# Patient Record
Sex: Female | Born: 1935 | Race: White | Hispanic: No | Marital: Single | Smoking: Former smoker
Health system: Southern US, Community
[De-identification: ages and names within clinical notes are randomized; demographics above are authoritative.]

## PROBLEM LIST (undated history)

## (undated) DIAGNOSIS — M1710 Unilateral primary osteoarthritis, unspecified knee: Secondary | ICD-10-CM

## (undated) DIAGNOSIS — M1990 Unspecified osteoarthritis, unspecified site: Secondary | ICD-10-CM

## (undated) DIAGNOSIS — H3530 Unspecified macular degeneration: Secondary | ICD-10-CM

## (undated) DIAGNOSIS — A0472 Enterocolitis due to Clostridium difficile, not specified as recurrent: Secondary | ICD-10-CM

## (undated) DIAGNOSIS — R0609 Other forms of dyspnea: Secondary | ICD-10-CM

## (undated) DIAGNOSIS — R0600 Dyspnea, unspecified: Secondary | ICD-10-CM

## (undated) DIAGNOSIS — E785 Hyperlipidemia, unspecified: Secondary | ICD-10-CM

## (undated) DIAGNOSIS — E860 Dehydration: Secondary | ICD-10-CM

## (undated) DIAGNOSIS — R351 Nocturia: Secondary | ICD-10-CM

## (undated) DIAGNOSIS — R5383 Other fatigue: Secondary | ICD-10-CM

## (undated) DIAGNOSIS — I10 Essential (primary) hypertension: Secondary | ICD-10-CM

## (undated) DIAGNOSIS — R600 Localized edema: Secondary | ICD-10-CM

## (undated) HISTORY — DX: Hyperlipidemia, unspecified: E78.5

## (undated) HISTORY — DX: Localized edema: R60.0

## (undated) HISTORY — DX: Unspecified osteoarthritis, unspecified site: M19.90

## (undated) HISTORY — DX: Dyspnea, unspecified: R06.00

## (undated) HISTORY — PX: DILATION AND CURETTAGE OF UTERUS: SHX78

## (undated) HISTORY — DX: Essential (primary) hypertension: I10

## (undated) HISTORY — DX: Unspecified macular degeneration: H35.30

## (undated) HISTORY — DX: Dehydration: E86.0

## (undated) HISTORY — DX: Other forms of dyspnea: R06.09

## (undated) HISTORY — DX: Unilateral primary osteoarthritis, unspecified knee: M17.10

## (undated) HISTORY — DX: Enterocolitis due to Clostridium difficile, not specified as recurrent: A04.72

## (undated) HISTORY — DX: Other fatigue: R53.83

## (undated) HISTORY — DX: Nocturia: R35.1

---

## 1998-02-04 ENCOUNTER — Other Ambulatory Visit: Admission: RE | Admit: 1998-02-04 | Discharge: 1998-02-04 | Payer: Self-pay | Admitting: Cardiology

## 2004-01-08 ENCOUNTER — Ambulatory Visit (HOSPITAL_COMMUNITY): Admission: RE | Admit: 2004-01-08 | Discharge: 2004-01-08 | Payer: Self-pay | Admitting: Surgery

## 2004-03-07 ENCOUNTER — Ambulatory Visit (HOSPITAL_COMMUNITY): Admission: RE | Admit: 2004-03-07 | Discharge: 2004-03-08 | Payer: Self-pay | Admitting: Surgery

## 2004-03-07 ENCOUNTER — Encounter (INDEPENDENT_AMBULATORY_CARE_PROVIDER_SITE_OTHER): Payer: Self-pay | Admitting: Specialist

## 2004-07-18 ENCOUNTER — Encounter: Admission: RE | Admit: 2004-07-18 | Discharge: 2004-07-18 | Payer: Self-pay | Admitting: Rheumatology

## 2009-07-24 ENCOUNTER — Ambulatory Visit: Payer: Self-pay | Admitting: Oncology

## 2009-07-31 LAB — CBC WITH DIFFERENTIAL/PLATELET
Eosinophils Absolute: 0 10*3/uL (ref 0.0–0.5)
HCT: 35.1 % (ref 34.8–46.6)
HGB: 10.9 g/dL — ABNORMAL LOW (ref 11.6–15.9)
LYMPH%: 14 % (ref 14.0–49.7)
MONO#: 1.6 10*3/uL — ABNORMAL HIGH (ref 0.1–0.9)
NEUT#: 4.8 10*3/uL (ref 1.5–6.5)
NEUT%: 64.5 % (ref 38.4–76.8)
Platelets: 59 10*3/uL — ABNORMAL LOW (ref 145–400)
WBC: 7.4 10*3/uL (ref 3.9–10.3)
nRBC: 0 % (ref 0–0)

## 2009-07-31 LAB — IRON AND TIBC
%SAT: 24 % (ref 20–55)
Iron: 72 ug/dL (ref 42–145)
UIBC: 233 ug/dL

## 2009-08-26 ENCOUNTER — Ambulatory Visit: Payer: Self-pay | Admitting: Oncology

## 2009-08-26 LAB — CBC WITH DIFFERENTIAL/PLATELET
Basophils Absolute: 0 10*3/uL (ref 0.0–0.1)
HCT: 35.6 % (ref 34.8–46.6)
HGB: 10.7 g/dL — ABNORMAL LOW (ref 11.6–15.9)
MONO#: 1 10*3/uL — ABNORMAL HIGH (ref 0.1–0.9)
NEUT%: 51.4 % (ref 38.4–76.8)
WBC: 4.4 10*3/uL (ref 3.9–10.3)
lymph#: 1 10*3/uL (ref 0.9–3.3)

## 2009-10-17 ENCOUNTER — Ambulatory Visit: Payer: Self-pay | Admitting: Oncology

## 2009-10-21 LAB — CBC WITH DIFFERENTIAL/PLATELET
Basophils Absolute: 0 10*3/uL (ref 0.0–0.1)
Eosinophils Absolute: 0.1 10*3/uL (ref 0.0–0.5)
HCT: 36.9 % (ref 34.8–46.6)
HGB: 11.3 g/dL — ABNORMAL LOW (ref 11.6–15.9)
LYMPH%: 22.3 % (ref 14.0–49.7)
MCV: 82.7 fL (ref 79.5–101.0)
MONO%: 22.6 % — ABNORMAL HIGH (ref 0.0–14.0)
NEUT#: 2.3 10*3/uL (ref 1.5–6.5)
NEUT%: 52.2 % (ref 38.4–76.8)
Platelets: 54 10*3/uL — ABNORMAL LOW (ref 145–400)
RBC: 4.46 10*6/uL (ref 3.70–5.45)

## 2010-02-13 ENCOUNTER — Ambulatory Visit: Payer: Self-pay | Admitting: Oncology

## 2010-06-20 ENCOUNTER — Inpatient Hospital Stay (HOSPITAL_COMMUNITY): Admission: EM | Admit: 2010-06-20 | Discharge: 2010-07-01 | Payer: Self-pay | Admitting: Emergency Medicine

## 2010-07-24 ENCOUNTER — Ambulatory Visit (HOSPITAL_COMMUNITY): Admission: RE | Admit: 2010-07-24 | Discharge: 2010-07-24 | Payer: Self-pay | Admitting: Internal Medicine

## 2010-09-03 ENCOUNTER — Ambulatory Visit: Payer: Self-pay | Admitting: Cardiology

## 2010-10-03 ENCOUNTER — Emergency Department (HOSPITAL_COMMUNITY)
Admission: EM | Admit: 2010-10-03 | Discharge: 2010-10-03 | Payer: Self-pay | Source: Home / Self Care | Admitting: Emergency Medicine

## 2011-01-02 ENCOUNTER — Ambulatory Visit (INDEPENDENT_AMBULATORY_CARE_PROVIDER_SITE_OTHER): Payer: Medicare Other | Admitting: Cardiology

## 2011-01-02 ENCOUNTER — Ambulatory Visit: Payer: Self-pay | Admitting: Cardiology

## 2011-01-02 DIAGNOSIS — D61818 Other pancytopenia: Secondary | ICD-10-CM

## 2011-01-02 DIAGNOSIS — Z79899 Other long term (current) drug therapy: Secondary | ICD-10-CM

## 2011-01-02 DIAGNOSIS — E7800 Pure hypercholesterolemia, unspecified: Secondary | ICD-10-CM

## 2011-01-15 LAB — BASIC METABOLIC PANEL
BUN: 26 mg/dL — ABNORMAL HIGH (ref 6–23)
BUN: 26 mg/dL — ABNORMAL HIGH (ref 6–23)
BUN: 27 mg/dL — ABNORMAL HIGH (ref 6–23)
BUN: 32 mg/dL — ABNORMAL HIGH (ref 6–23)
BUN: 34 mg/dL — ABNORMAL HIGH (ref 6–23)
BUN: 42 mg/dL — ABNORMAL HIGH (ref 6–23)
BUN: 48 mg/dL — ABNORMAL HIGH (ref 6–23)
CO2: 22 mEq/L (ref 19–32)
CO2: 24 mEq/L (ref 19–32)
CO2: 24 mEq/L (ref 19–32)
CO2: 25 mEq/L (ref 19–32)
Calcium: 8 mg/dL — ABNORMAL LOW (ref 8.4–10.5)
Calcium: 8.5 mg/dL (ref 8.4–10.5)
Calcium: 8.6 mg/dL (ref 8.4–10.5)
Calcium: 9.3 mg/dL (ref 8.4–10.5)
Calcium: 9.4 mg/dL (ref 8.4–10.5)
Chloride: 102 mEq/L (ref 96–112)
Chloride: 105 mEq/L (ref 96–112)
Chloride: 107 mEq/L (ref 96–112)
Chloride: 113 mEq/L — ABNORMAL HIGH (ref 96–112)
Creatinine, Ser: 1.63 mg/dL — ABNORMAL HIGH (ref 0.4–1.2)
Creatinine, Ser: 1.72 mg/dL — ABNORMAL HIGH (ref 0.4–1.2)
Creatinine, Ser: 1.79 mg/dL — ABNORMAL HIGH (ref 0.4–1.2)
Creatinine, Ser: 2.14 mg/dL — ABNORMAL HIGH (ref 0.4–1.2)
Creatinine, Ser: 2.5 mg/dL — ABNORMAL HIGH (ref 0.4–1.2)
Creatinine, Ser: 2.93 mg/dL — ABNORMAL HIGH (ref 0.4–1.2)
Creatinine, Ser: 3.11 mg/dL — ABNORMAL HIGH (ref 0.4–1.2)
Creatinine, Ser: 3.11 mg/dL — ABNORMAL HIGH (ref 0.4–1.2)
GFR calc Af Amer: 17 mL/min — ABNORMAL LOW (ref >60)
GFR calc Af Amer: 21 mL/min — ABNORMAL LOW (ref >60)
GFR calc Af Amer: 22 mL/min — ABNORMAL LOW (ref >60)
GFR calc non Af Amer: 22 mL/min — ABNORMAL LOW (ref >60)
GFR calc non Af Amer: 27 mL/min — ABNORMAL LOW (ref >60)
GFR calc non Af Amer: 30 mL/min — ABNORMAL LOW (ref >60)
Glucose, Bld: 117 mg/dL — ABNORMAL HIGH (ref 70–99)
Glucose, Bld: 121 mg/dL — ABNORMAL HIGH (ref 70–99)
Glucose, Bld: 79 mg/dL (ref 70–99)
Glucose, Bld: 96 mg/dL (ref 70–99)
Potassium: 3.8 mEq/L (ref 3.5–5.1)
Potassium: 4 mEq/L (ref 3.5–5.1)
Potassium: 4.3 mEq/L (ref 3.5–5.1)
Potassium: 4.5 mEq/L (ref 3.5–5.1)

## 2011-01-15 LAB — URINE MICROSCOPIC-ADD ON

## 2011-01-15 LAB — URINALYSIS, MICROSCOPIC ONLY
Bilirubin Urine: NEGATIVE
Glucose, UA: NEGATIVE mg/dL
Specific Gravity, Urine: 1.012 (ref 1.005–1.030)

## 2011-01-15 LAB — CULTURE, BLOOD (ROUTINE X 2): Culture: NO GROWTH

## 2011-01-15 LAB — COMPREHENSIVE METABOLIC PANEL
ALT: 14 U/L (ref 0–35)
BUN: 44 mg/dL — ABNORMAL HIGH (ref 6–23)
CO2: 26 mEq/L (ref 19–32)
Calcium: 8.5 mg/dL (ref 8.4–10.5)
GFR calc non Af Amer: 18 mL/min — ABNORMAL LOW (ref >60)
Glucose, Bld: 132 mg/dL — ABNORMAL HIGH (ref 70–99)
Sodium: 134 mEq/L — ABNORMAL LOW (ref 135–145)

## 2011-01-15 LAB — DIFFERENTIAL
Eosinophils Absolute: 0 10*3/uL (ref 0.0–0.7)
Eosinophils Relative: 0 % (ref 0–5)
Lymphocytes Relative: 8 % — ABNORMAL LOW (ref 12–46)
Monocytes Absolute: 3 10*3/uL — ABNORMAL HIGH (ref 0.1–1.0)
Neutrophils Relative %: 79 % — ABNORMAL HIGH (ref 43–77)

## 2011-01-15 LAB — STOOL CULTURE

## 2011-01-15 LAB — CLOSTRIDIUM DIFFICILE EIA

## 2011-01-15 LAB — BRAIN NATRIURETIC PEPTIDE: Pro B Natriuretic peptide (BNP): 150 pg/mL — ABNORMAL HIGH (ref 0.0–100.0)

## 2011-01-15 LAB — CBC
HCT: 32.9 % — ABNORMAL LOW (ref 36.0–46.0)
HCT: 36.1 % (ref 36.0–46.0)
Hemoglobin: 10.1 g/dL — ABNORMAL LOW (ref 12.0–15.0)
Hemoglobin: 11.3 g/dL — ABNORMAL LOW (ref 12.0–15.0)
MCH: 24.7 pg — ABNORMAL LOW (ref 26.0–34.0)
MCH: 24.8 pg — ABNORMAL LOW (ref 26.0–34.0)
MCH: 25.1 pg — ABNORMAL LOW (ref 26.0–34.0)
MCH: 25.3 pg — ABNORMAL LOW (ref 26.0–34.0)
MCH: 25.4 pg — ABNORMAL LOW (ref 26.0–34.0)
MCH: 25.6 pg — ABNORMAL LOW (ref 26.0–34.0)
MCHC: 30.7 g/dL (ref 30.0–36.0)
MCHC: 31 g/dL (ref 30.0–36.0)
MCHC: 31.3 g/dL (ref 30.0–36.0)
MCHC: 31.3 g/dL (ref 30.0–36.0)
MCV: 79.4 fL (ref 78.0–100.0)
MCV: 80.8 fL (ref 78.0–100.0)
MCV: 81.6 fL (ref 78.0–100.0)
MCV: 81.8 fL (ref 78.0–100.0)
MCV: 81.9 fL (ref 78.0–100.0)
MCV: 84.2 fL (ref 78.0–100.0)
Platelets: 109 10*3/uL — ABNORMAL LOW (ref 150–400)
Platelets: 41 10*3/uL — ABNORMAL LOW (ref 150–400)
Platelets: 68 10*3/uL — ABNORMAL LOW (ref 150–400)
Platelets: 71 10*3/uL — ABNORMAL LOW (ref 150–400)
Platelets: DECREASED 10*3/uL (ref 150–400)
RBC: 3.98 MIL/uL (ref 3.87–5.11)
RBC: 4.02 MIL/uL (ref 3.87–5.11)
RDW: 13.7 % (ref 11.5–15.5)
RDW: 14.3 % (ref 11.5–15.5)
RDW: 14.4 % (ref 11.5–15.5)
WBC: 7.5 10*3/uL (ref 4.0–10.5)
WBC: 8.1 10*3/uL (ref 4.0–10.5)

## 2011-01-15 LAB — URINALYSIS, ROUTINE W REFLEX MICROSCOPIC
Protein, ur: 30 mg/dL — AB
Specific Gravity, Urine: 1.02 (ref 1.005–1.030)
Urobilinogen, UA: 1 mg/dL (ref 0.0–1.0)
pH: 5 (ref 5.0–8.0)

## 2011-01-15 LAB — RENAL FUNCTION PANEL
Albumin: 2.5 g/dL — ABNORMAL LOW (ref 3.5–5.2)
BUN: 41 mg/dL — ABNORMAL HIGH (ref 6–23)
Creatinine, Ser: 3.02 mg/dL — ABNORMAL HIGH (ref 0.4–1.2)
Glucose, Bld: 121 mg/dL — ABNORMAL HIGH (ref 70–99)
Phosphorus: 4.8 mg/dL — ABNORMAL HIGH (ref 2.3–4.6)
Potassium: 4 mEq/L (ref 3.5–5.1)

## 2011-01-15 LAB — URINE CULTURE

## 2011-01-15 LAB — OVA AND PARASITE EXAMINATION

## 2011-01-27 IMAGING — RF DG ESOPHAGUS
19 of 24 series · 19 of 24 positions shown · non-contrast
Comparison: None

CLINICAL DATA: Gastroesophageal reflux.

ESOPHOGRAM / BARIUM SWALLOW
TECHNIQUE: Double contrast esophagram was performed.
Fluoroscopy time:  3.0 minutes.

[Series 1: run · 1 of 11 slices shown (1 of 19)]
[im 1/11]
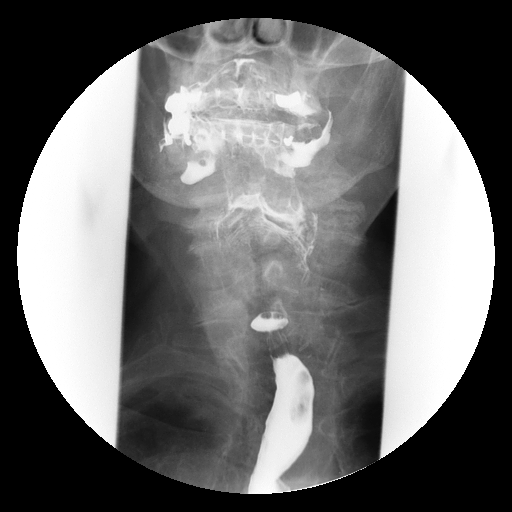

[Series 2: run · 1 of 9 slices shown (2 of 19)]
[im 1/9]
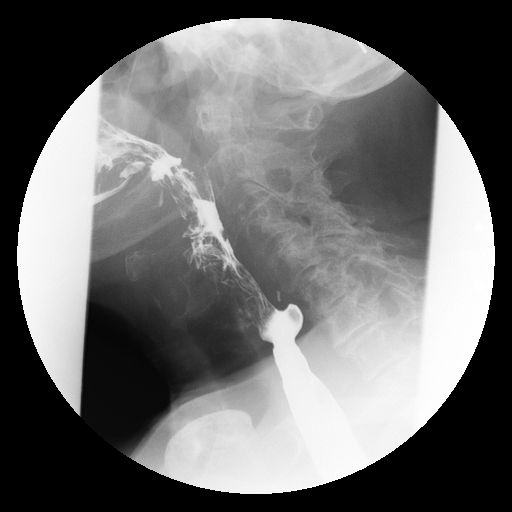

[Series 4: run · 1 of 1 slices shown (3 of 19)]
[im 1/1]
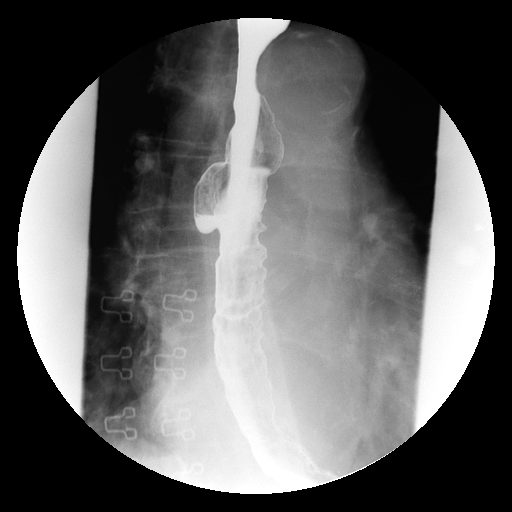

[Series 5: run · 1 of 2 slices shown (4 of 19)]
[im 1/2]
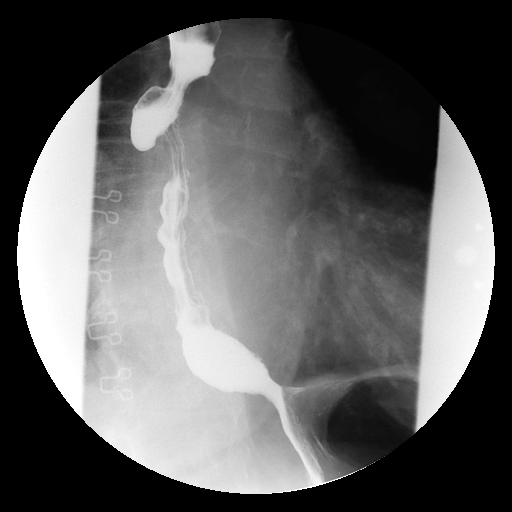

[Series 6: run · 1 of 1 slices shown (5 of 19)]
[im 1/1]
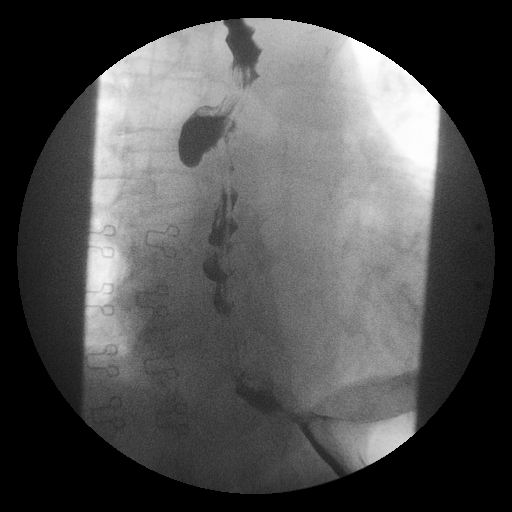

[Series 7: run · 1 of 1 slices shown (6 of 19)]
[im 1/1]
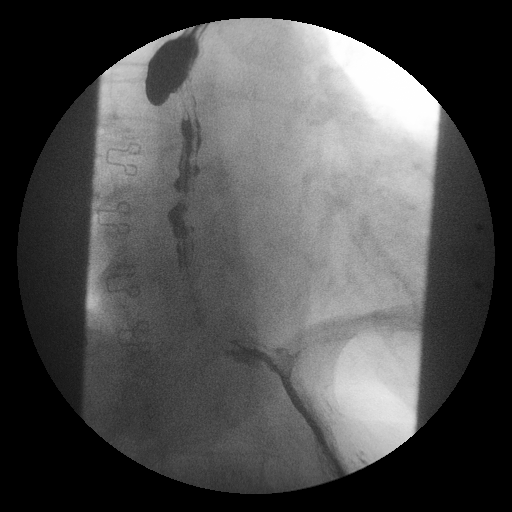

[Series 9: run · 1 of 2 slices shown (7 of 19)]
[im 1/2]
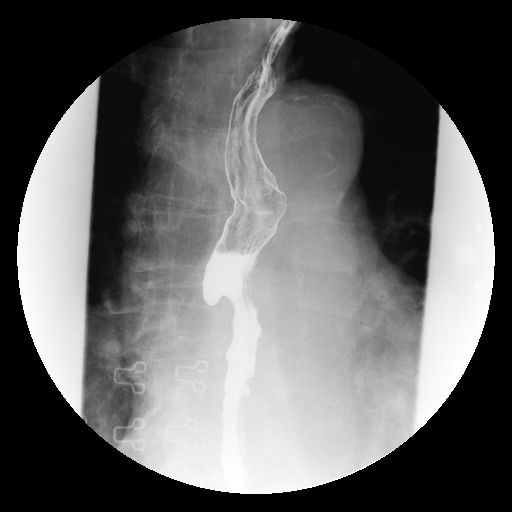

[Series 10: run · 1 of 1 slices shown (8 of 19)]
[im 1/1]
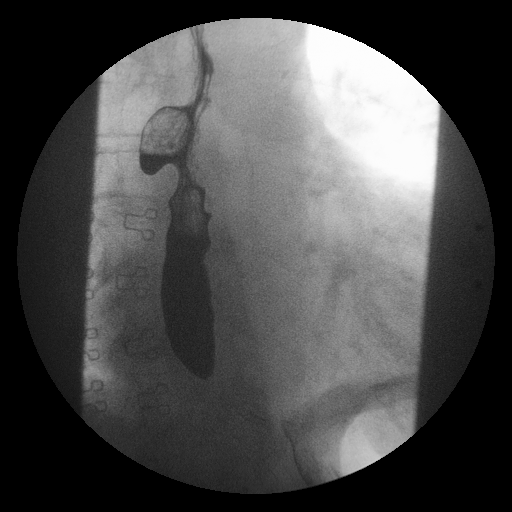

[Series 11: run · 1 of 1 slices shown (9 of 19)]
[im 1/1]
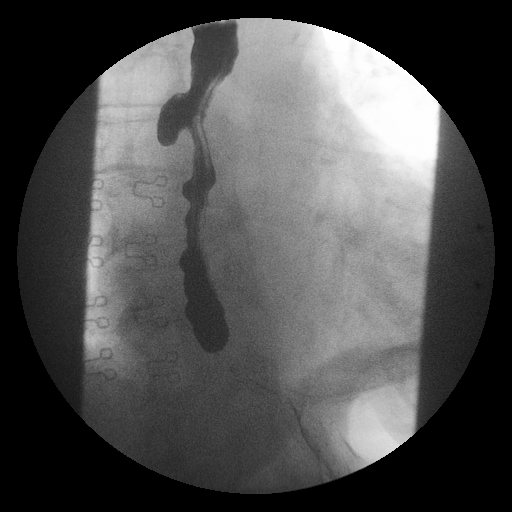

[Series 13: run · 1 of 1 slices shown (10 of 19)]
[im 1/1]
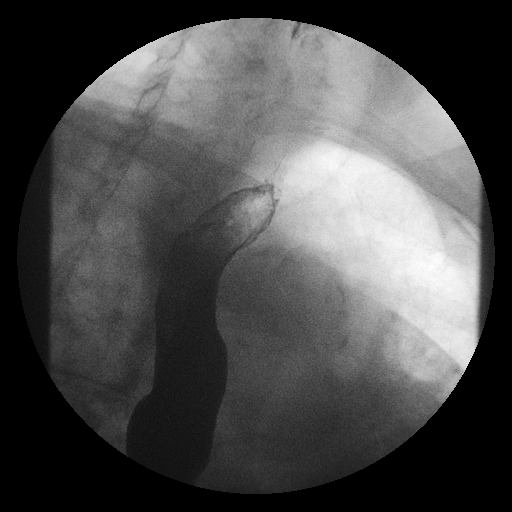

[Series 14: run · 1 of 1 slices shown (11 of 19)]
[im 1/1]
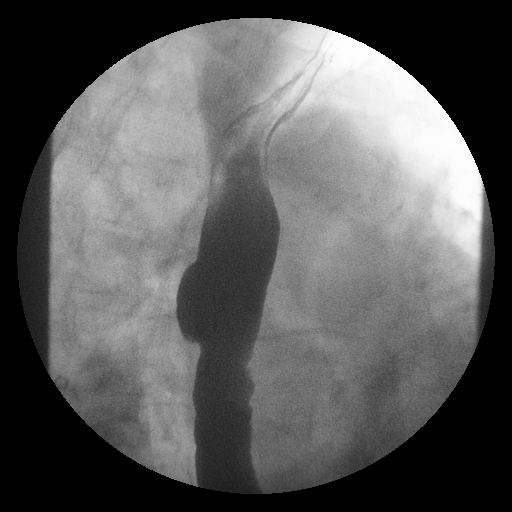

[Series 15: run · 1 of 1 slices shown (12 of 19)]
[im 1/1]
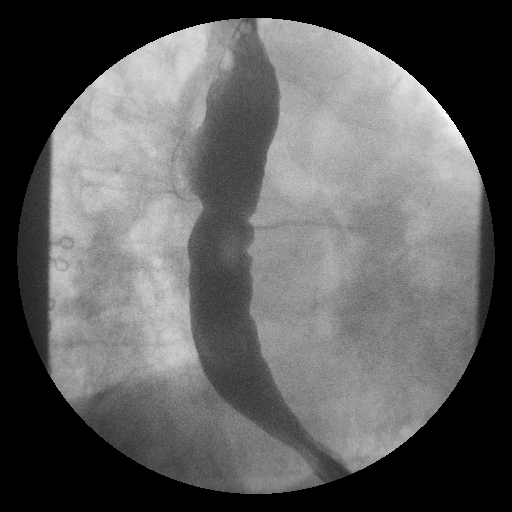

[Series 16: run · 1 of 1 slices shown (13 of 19)]
[im 1/1]
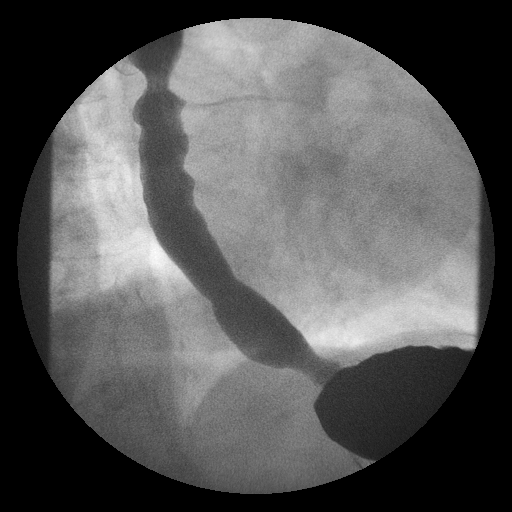

[Series 18: run · 1 of 1 slices shown (14 of 19)]
[im 1/1]
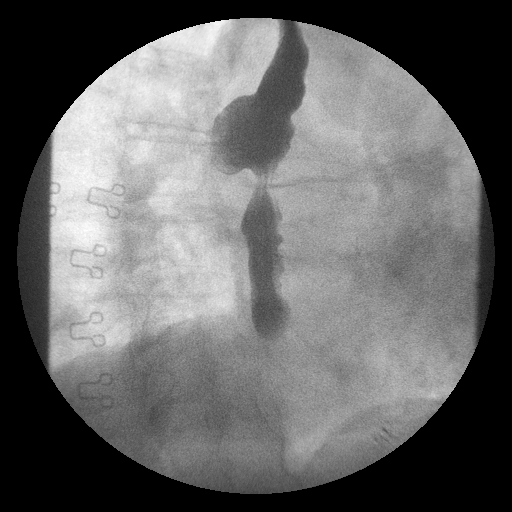

[Series 19: run · 1 of 1 slices shown (15 of 19)]
[im 1/1]
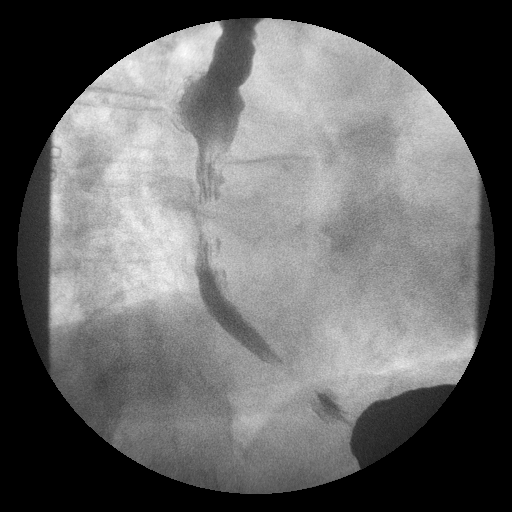

[Series 20: run · 1 of 1 slices shown (16 of 19)]
[im 1/1]
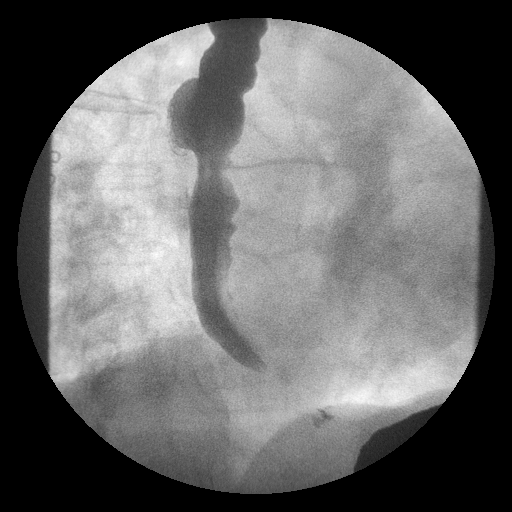

[Series 21: run · 1 of 1 slices shown (17 of 19)]
[im 1/1]
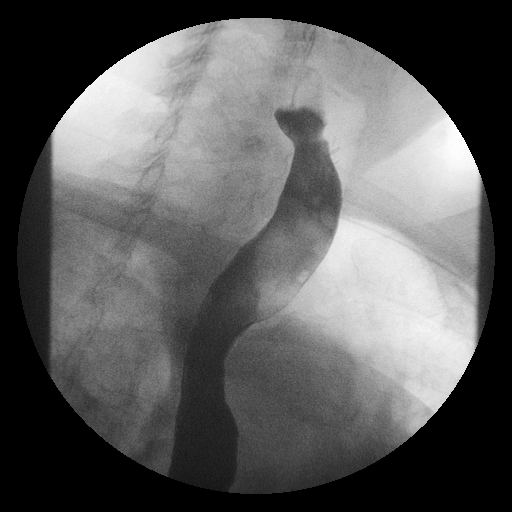

[Series 23: run · 1 of 1 slices shown (18 of 19)]
[im 1/1]
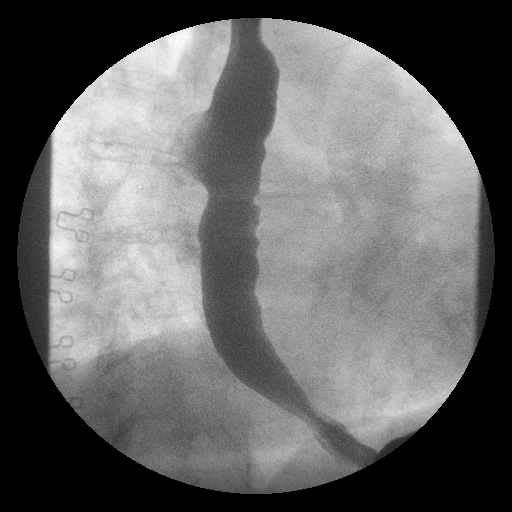

[Series 24: run · 1 of 1 slices shown (19 of 19)]
[im 1/1]
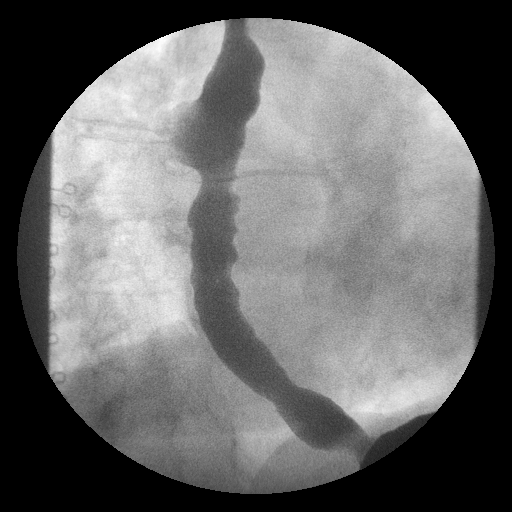

[19 of 24 positions shown; findings below may reference images not displayed]

FINDINGS: The pharyngeal phase of swallowing demonstrates Laaouina Tiger
Blz diverticulum to the.  Cervical spondylosis is present.

Primary peristaltic waves were disrupted on [DATE] swallows, with
extensive tertiary contractions noted.

A 17 mm right lateral diverticulum of the esophagus is present at
the level of the pulmonary veins.  No esophageal stricture is
identified.

No definite ulceration is noted although double contrast images are
mildly limited particularly in the distal esophagus.

There is a suggestion of mild distal esophageal fold thickening
which could reflect mild esophagitis.  A small hiatal hernia is
present.

A 13 mm barium tablet passed without difficulty into the stomach.
IMPRESSION: 1.  Jus Keiser diverticulum.
2.  Moderate sized right eccentric diverticulum of the mid-to-
distal thoracic esophagus.
3.  Nonspecific esophageal dysmotility, with extensive tertiary
contractions.
4.  Small hiatal hernia.
5.  Mild distal esophageal fold thickening suggests mild distal
esophagitis.

## 2011-02-17 ENCOUNTER — Telehealth: Payer: Self-pay | Admitting: Cardiology

## 2011-02-17 NOTE — Telephone Encounter (Signed)
Needs a refill of Crestor. I have pulled the file.

## 2011-02-17 NOTE — Telephone Encounter (Signed)
Spoke to daughter and wanted samples. Got together crestor 10 mg for patient

## 2011-03-20 NOTE — Op Note (Signed)
NAME:  Reppert, Nikeria L                        ACCOUNT NO.:  [id number]   MEDICAL RECORD NO.:  [id number]                   PATIENT TYPE:  OIB   LOCATION:  5733                                 FACILITY:  [facility]   PHYSICIAN:  Velora Heckler, M.D.                DATE OF BIRTH:  [birth year]   DATE OF PROCEDURE:  03/07/2004  DATE OF DISCHARGE:                                 OPERATIVE REPORT   PREOPERATIVE DIAGNOSIS:  Primary hyperparathyroidism.   POSTOPERATIVE DIAGNOSIS:  Primary hyperparathyroidism.   PROCEDURE:  Minimally invasive parathyroidectomy (left superior gland).   SURGEON:  Velora Heckler, M.D.   ASSISTANT:  Abigail Miyamoto, M.D.   ANESTHESIA:  General.   ESTIMATED BLOOD LOSS:  Minimal.   PREPARATION:  Betadine.   COMPLICATIONS:  None.   INDICATIONS FOR PROCEDURE:  The patient is an 77 year old white female who  was referred by Dr. Cassell Clement and Dr. Aida Puffer for elevated serum  calcium levels.  The patient had a bone density scan showing osteoporosis.  She has a history of nephrolithiasis.  The calcium levels have ranged from  10.9 to 11.6.  Parathyroid hormone level is elevated at 103.7.  A Sestamibi  scan was obtained which showed a left-sided parathyroid adenoma.  The  patient now comes to surgery for a parathyroidectomy.   DESCRIPTION OF PROCEDURE:  The procedure is done in operating room #16 at  the [facility]. [facility].  The patient is brought to the  operating room and placed in the supine position on the operating room  table.  Following the administration of general anesthesia, the patient is  prepped and draped in the usual strict aseptic fashion.  The neck is scanned  with the Neo probe and an area of increased radioactivity in the left upper  neck is marked.  A 3 cm incision is made with a #15 blade and carried  through subcutaneous tissues and platysma.  Hemostasis was obtained with the  electrocautery.  The strap muscles are  incised in the midline and reflected  laterally.  The superior pole of the thyroid lobe was identified.  There is  a nodular density in the superior pole of the thyroid, which on probing with  the Neo probe appears to have increased radioactive counts.  This nodule was  carefully dissected out.  The vascular structures are divided between small  Ligaclips.  The nodule is completely excised.  It measures approximately 1  cm; however, ex vivo, it has counts of only approximately 10% of background.  The nodule is sectioned on the table and has the appearance of thyroid  tissue.  It is placed in a specimen cup on the back table.  Further scanning  of then neck shows persistent elevated radioactivity in the left neck.  Further exploration reveals the larynx, the hypopharynx, the esophagus and a  soft tissue mass posterior to the esophagus along  the cervical fascia.  This  is carefully dissected out.  It measures approximately 3 cm in the greatest  dimension.  The vascular structures are divided between small Ligaclips.  The gland is gently mobilized from the pre-cervical space.  It is delivered  up and into the wound, and completely excised.  Ex vivo it has counts of  approximately 300%-400% of background.  On sectioning, there appears to be  parathyroid tissue.  The specimen is submitted to pathology, and Dr. Kieth Brightly confirms parathyroid tissue on frozen section.  Scanning of the neck  after excision of the mass shows background levels of radioactivity only.  The neck is irrigated with warm saline.  A Surgicel was placed in the area  of the bed of the parathyroid adenoma.  A small piece of Surgicel was placed  over the superior pole of the thyroid at the site of the excision of the  thyroid nodule.  The strap muscles were reapproximated in the midline with  interrupted #3-0 Vicryl sutures.  The platysma was closed with interrupted  #3-0 Vicryl sutures.  The skin is closed with a running  #4-0 Vicryl  subcuticular suture.  The wound is washed and dried, and Benzoin and Steri-  Strips are applied. Sterile dressings are applied.  The patient is awakened from anesthesia and brought to the recovery room in  stable condition.  The patient tolerated the procedure well.                                               Velora Heckler, M.D.    TMG/MEDQ  D:  03/07/2004  T:  03/08/2004  Job:  [id number]   cc:   Cassell Clement, M.D.  [address]  [place]  [place] [address]  Fax: [phone]   Aida Puffer, M.D.  [place], [place]   Aundra Dubin, M.D.

## 2011-04-07 ENCOUNTER — Telehealth: Payer: Self-pay | Admitting: Cardiology

## 2011-04-07 NOTE — Telephone Encounter (Signed)
Gathered for patient

## 2011-04-07 NOTE — Telephone Encounter (Signed)
SAMPLES OF CRESTOR [phone] AND LET PT KNOW.

## 2011-05-01 ENCOUNTER — Other Ambulatory Visit: Payer: Self-pay | Admitting: *Deleted

## 2011-05-01 DIAGNOSIS — E785 Hyperlipidemia, unspecified: Secondary | ICD-10-CM

## 2011-05-04 ENCOUNTER — Encounter: Payer: Self-pay | Admitting: Cardiology

## 2011-05-04 ENCOUNTER — Ambulatory Visit: Payer: Medicare Other | Admitting: *Deleted

## 2011-05-04 ENCOUNTER — Other Ambulatory Visit (INDEPENDENT_AMBULATORY_CARE_PROVIDER_SITE_OTHER): Payer: Medicare Other | Admitting: *Deleted

## 2011-05-04 ENCOUNTER — Ambulatory Visit (INDEPENDENT_AMBULATORY_CARE_PROVIDER_SITE_OTHER): Payer: Medicare Other | Admitting: Cardiology

## 2011-05-04 VITALS — BP 146/80 | HR 64 | Wt 154.0 lb

## 2011-05-04 DIAGNOSIS — D61818 Other pancytopenia: Secondary | ICD-10-CM | POA: Insufficient documentation

## 2011-05-04 DIAGNOSIS — L03818 Cellulitis of other sites: Secondary | ICD-10-CM

## 2011-05-04 DIAGNOSIS — M1990 Unspecified osteoarthritis, unspecified site: Secondary | ICD-10-CM

## 2011-05-04 DIAGNOSIS — Z79899 Other long term (current) drug therapy: Secondary | ICD-10-CM

## 2011-05-04 DIAGNOSIS — E785 Hyperlipidemia, unspecified: Secondary | ICD-10-CM

## 2011-05-04 DIAGNOSIS — L02818 Cutaneous abscess of other sites: Secondary | ICD-10-CM | POA: Insufficient documentation

## 2011-05-04 DIAGNOSIS — I119 Hypertensive heart disease without heart failure: Secondary | ICD-10-CM

## 2011-05-04 LAB — CBC WITH DIFFERENTIAL/PLATELET
Basophils Absolute: 0 10*3/uL (ref 0.0–0.1)
Basophils Relative: 0.5 % (ref 0.0–3.0)
Eosinophils Absolute: 0.1 10*3/uL (ref 0.0–0.7)
Eosinophils Relative: 2.7 % (ref 0.0–5.0)
HCT: 33.7 % — ABNORMAL LOW (ref 36.0–46.0)
Hemoglobin: 11.1 g/dL — ABNORMAL LOW (ref 12.0–15.0)
Lymphocytes Relative: 26.2 % (ref 12.0–46.0)
Lymphs Abs: 1.1 10*3/uL (ref 0.7–4.0)
MCHC: 33 g/dL (ref 30.0–36.0)
MCV: 78.8 fl (ref 78.0–100.0)
Monocytes Absolute: 0.9 10*3/uL (ref 0.1–1.0)
Monocytes Relative: 20.8 % — ABNORMAL HIGH (ref 3.0–12.0)
Neutro Abs: 2.1 10*3/uL (ref 1.4–7.7)
Neutrophils Relative %: 49.8 % (ref 43.0–77.0)
Platelets: 52 10*3/uL — ABNORMAL LOW (ref 150.0–400.0)
RBC: 4.28 Mil/uL (ref 3.87–5.11)
RDW: 14.6 % (ref 11.5–14.6)
WBC: 4.2 10*3/uL — ABNORMAL LOW (ref 4.5–10.5)

## 2011-05-04 LAB — HEPATIC FUNCTION PANEL
ALT: 11 U/L (ref 0–35)
AST: 23 U/L (ref 0–37)
Albumin: 4.5 g/dL (ref 3.5–5.2)
Alkaline Phosphatase: 49 U/L (ref 39–117)
Bilirubin, Direct: 0.1 mg/dL (ref 0.0–0.3)
Total Bilirubin: 0.7 mg/dL (ref 0.3–1.2)
Total Protein: 6.8 g/dL (ref 6.0–8.3)

## 2011-05-04 LAB — LIPID PANEL
Cholesterol: 140 mg/dL (ref 0–200)
LDL Cholesterol: 59 mg/dL (ref 0–99)

## 2011-05-04 LAB — BASIC METABOLIC PANEL
BUN: 34 mg/dL — ABNORMAL HIGH (ref 6–23)
Calcium: 9.7 mg/dL (ref 8.4–10.5)
Creatinine, Ser: 1.8 mg/dL — ABNORMAL HIGH (ref 0.4–1.2)
GFR: 27.3 mL/min — ABNORMAL LOW (ref >60.00)
Glucose, Bld: 92 mg/dL (ref 70–99)

## 2011-05-04 MED ORDER — CIPROFLOXACIN HCL 500 MG PO TABS: 500.0000 mg | ORAL_TABLET | Freq: Two times a day (BID) | ORAL | Status: AC

## 2011-05-04 NOTE — Progress Notes (Signed)
Kristina Shepherd Date of Birth:  October 12, 1918 [facility] / [facility] [address]   [address] [place], [place]  [address] [phone]           Fax   [phone]  History of Present Illness: This pleasant 77 year old woman is seen for a four-month followup office visit.  She has a past history of essential hypertension and hyperlipidemia.  She also has a history of pancytopenia.  She does not have any history of known ischemic heart disease.  She had a normal Cardiolite study in 2004.  She had an echocardiogram also in 2004 which showed mild concentric left ventricular hypertrophy with good left ventricular systolic function and she had mild mitral regurgitation.  Her right heart pressures were normal.  Since last visit she's had no new cardiac symptoms his had no dizziness or syncope.  She's had no precordial chest tightness to suggest angina.  She has had some recent cellulitis of her perineal area for which we gave her Cipro 500 mg twice a day For 5 days.  Current Outpatient Prescriptions  Medication Sig Dispense Refill  . diazepam (VALIUM) 5 MG tablet Take 5 mg by mouth at bedtime.        . dorzolamide-timolol (COSOPT) 22.3-6.8 MG/ML ophthalmic solution 1 drop daily.        . ferrous sulfate 325 (65 FE) MG tablet Take 650 mg by mouth daily with breakfast. Taking bid      . glucosamine-chondroitin 500-400 MG tablet Take 1 tablet by mouth daily.        . hydrochlorothiazide 25 MG tablet Take 25 mg by mouth daily.        . Ibuprofen (ADVIL PO) Take by mouth as needed.        . Multiple Vitamins-Minerals (EYE VITAMINS PO) Take by mouth daily.        Bertram Gala Glycol-Propyl Glycol (SYSTANE OP) Apply to eye daily.        . rosuvastatin (CRESTOR) 10 MG tablet Take 5 mg by mouth daily.        . ciprofloxacin (CIPRO) 500 MG tablet Take 1 tablet (500 mg total) by mouth 2 (two) times daily.  20 tablet  0  . DISCONTD: vitamin C (ASCORBIC ACID) 500 MG tablet Take 500 mg by mouth  daily.          Allergies  Allergen Reactions  . Clindamycin/Lincomycin     nausea  . Crestor (Rosuvastatin Calcium)     Patient Active Problem List  Diagnoses  . Benign hypertensive heart disease without heart failure  . Osteoarthritis  . Pancytopenia  . Cellulitis and abscess of other specified site    History  Smoking status  . Former Smoker  . Quit date: 05/03/1996  Smokeless tobacco  . Not on file    History  Alcohol Use No    Family History  Problem Relation Age of Onset  . Leukemia Mother   . Heart attack Father   . Heart disease Sister   . Cancer Brother   . Cancer Brother   . Cancer Brother   . Cancer Brother   . Cancer Brother   . Asthma Sister   . Asthma Sister     Review of Systems: Constitutional: no fever chills diaphoresis or fatigue or change in weight.  Head and neck: no hearing loss, no epistaxis, no photophobia or visual disturbance. Respiratory: No cough, shortness of breath or wheezing. Cardiovascular: No chest pain peripheral edema, palpitations. Gastrointestinal: No  abdominal distention, no abdominal pain, no change in bowel habits hematochezia or melena. Genitourinary: No dysuria, no frequency, no urgency, no nocturia. Musculoskeletal:No arthralgias, no back pain, no gait disturbance or myalgias. Neurological: No dizziness, no headaches, no numbness, no seizures, no syncope, no weakness, no tremors. Hematologic: No lymphadenopathy, no easy bruising. Psychiatric: No confusion, no hallucinations, no sleep disturbance.    Physical Exam: Filed Vitals:   05/04/11 1122  BP: 146/80  Pulse: 64  The general appearance reveals a well-developed well-nourished woman in no distress.Pupils equal and reactive.   Extraocular Movements are full.  There is no scleral icterus.  The mouth and pharynx are normal.  The neck is supple.  The carotids reveal no bruits.  The jugular venous pressure is normal.  The thyroid is not enlarged.  There is no  lymphadenopathy.The chest is clear to percussion and auscultation. There are no rales or rhonchi. Expansion of the chest is symmetrical.The precordium is quiet.  The first heart sound is normal.  The second heart sound is physiologically split.  There is no murmur gallop rub or click.  There is no abnormal lift or heave.The abdomen is soft and nontender. Bowel sounds are normal. The liver and spleen are not enlarged. There Are no abdominal masses. There are no bruits. Extremities reveal mild peripheral edema bilaterally .Strength is normal and symmetrical in all extremities.  There is no lateralizing weakness.  There are no sensory deficits.   Assessment / Plan: Continue same medication.  Blood work today pending.  Needs to work better on limiting her dietary salt and dietary sweets.  Recheck in oxygen level today and it is 93% on room air and she will use her home oxygen only if needed.

## 2011-05-04 NOTE — Assessment & Plan Note (Signed)
Patient has a past history of pancytopenia.  She has a chronically low platelet count.  She has elected not to pursue further treatment of this.

## 2011-05-04 NOTE — Assessment & Plan Note (Signed)
The patient has a history of osteoarthritis.  He has had no acute exacerbation recently.

## 2011-05-04 NOTE — Assessment & Plan Note (Signed)
The patient has a past history of essential hypertension.  She is not careful with her dietary salt or with her sugars.  She does have a history of chronic lower extremity edema secondary to venous insufficiency.  She does not have any history of ischemic heart disease.  She had a normal stress test in 2004.

## 2011-05-07 ENCOUNTER — Telehealth: Payer: Self-pay | Admitting: *Deleted

## 2011-05-07 DIAGNOSIS — R739 Hyperglycemia, unspecified: Secondary | ICD-10-CM

## 2011-05-07 DIAGNOSIS — E7800 Pure hypercholesterolemia, unspecified: Secondary | ICD-10-CM

## 2011-05-07 DIAGNOSIS — Z79899 Other long term (current) drug therapy: Secondary | ICD-10-CM

## 2011-05-07 NOTE — Progress Notes (Signed)
Advised daughter 

## 2011-05-07 NOTE — Telephone Encounter (Signed)
Message copied by Burnell Blanks on Thu May 07, 2011  2:14 PM ------      Message from: Cassell Clement      Created: Mon May 04, 2011 10:09 PM       This report.  Her kidneys are little drier.  She needs to drink more water and cut down on salt.Her liver tests are normal.  Her albumin is back to normal and her protein levels have improved significantly since last visit.The lipids are okayWith a cholesterol of 140 and triglycerides of 142.Her CBC still shows pancytopenia with low platelet count and no new treatment is indicated.  We should recheck a CBC when we see her for her next office visit

## 2011-05-07 NOTE — Telephone Encounter (Signed)
Advised daughter of labs 

## 2011-07-02 ENCOUNTER — Telehealth: Payer: Self-pay | Admitting: Cardiology

## 2011-07-02 NOTE — Telephone Encounter (Signed)
Samples gathered for patient 

## 2011-07-02 NOTE — Telephone Encounter (Signed)
Pt states would like some Crestor samples.  Please call pt back.

## 2011-08-18 ENCOUNTER — Telehealth: Payer: Self-pay | Admitting: *Deleted

## 2011-08-18 NOTE — Telephone Encounter (Signed)
Crestor 10 mg #21 lot # [id number] exp 11/14 Samples gathered

## 2011-08-19 ENCOUNTER — Other Ambulatory Visit: Payer: Self-pay | Admitting: Cardiology

## 2011-08-19 DIAGNOSIS — F419 Anxiety disorder, unspecified: Secondary | ICD-10-CM

## 2011-08-19 DIAGNOSIS — I119 Hypertensive heart disease without heart failure: Secondary | ICD-10-CM

## 2011-08-19 NOTE — Telephone Encounter (Signed)
Refilled diazepam and hydrodiuril

## 2011-09-28 ENCOUNTER — Telehealth: Payer: Self-pay | Admitting: Cardiology

## 2011-09-28 NOTE — Telephone Encounter (Signed)
New Problem:    Would like some samples of Crestor 10mg .

## 2011-09-28 NOTE — Telephone Encounter (Signed)
Crestor 10 mg 1/2 daily, samples gathered.   Lot # [id number] EXP 06/15 #21, advised

## 2011-10-14 ENCOUNTER — Other Ambulatory Visit (INDEPENDENT_AMBULATORY_CARE_PROVIDER_SITE_OTHER): Payer: Medicare Other | Admitting: *Deleted

## 2011-10-14 ENCOUNTER — Ambulatory Visit (INDEPENDENT_AMBULATORY_CARE_PROVIDER_SITE_OTHER): Payer: Medicare Other | Admitting: Cardiology

## 2011-10-14 ENCOUNTER — Encounter: Payer: Self-pay | Admitting: Cardiology

## 2011-10-14 VITALS — BP 128/78 | HR 60 | Ht 62.0 in | Wt 154.0 lb

## 2011-10-14 DIAGNOSIS — D61818 Other pancytopenia: Secondary | ICD-10-CM

## 2011-10-14 DIAGNOSIS — R0609 Other forms of dyspnea: Secondary | ICD-10-CM

## 2011-10-14 DIAGNOSIS — Z79899 Other long term (current) drug therapy: Secondary | ICD-10-CM

## 2011-10-14 DIAGNOSIS — I119 Hypertensive heart disease without heart failure: Secondary | ICD-10-CM

## 2011-10-14 DIAGNOSIS — R739 Hyperglycemia, unspecified: Secondary | ICD-10-CM

## 2011-10-14 DIAGNOSIS — E7800 Pure hypercholesterolemia, unspecified: Secondary | ICD-10-CM

## 2011-10-14 DIAGNOSIS — R7309 Other abnormal glucose: Secondary | ICD-10-CM

## 2011-10-14 DIAGNOSIS — R0600 Dyspnea, unspecified: Secondary | ICD-10-CM

## 2011-10-14 LAB — LIPID PANEL
HDL: 49.7 mg/dL (ref >39.00)
Triglycerides: 108 mg/dL (ref 0.0–149.0)

## 2011-10-14 LAB — CBC WITH DIFFERENTIAL/PLATELET
Hemoglobin: 10.9 g/dL — ABNORMAL LOW (ref 12.0–15.0)
MCHC: 31.8 g/dL (ref 30.0–36.0)
RDW: 14.8 % — ABNORMAL HIGH (ref 11.5–14.6)

## 2011-10-14 LAB — HEPATIC FUNCTION PANEL
ALT: 10 U/L (ref 0–35)
AST: 20 U/L (ref 0–37)
Albumin: 4.3 g/dL (ref 3.5–5.2)
Alkaline Phosphatase: 51 U/L (ref 39–117)
Total Bilirubin: 0.7 mg/dL (ref 0.3–1.2)

## 2011-10-14 LAB — BASIC METABOLIC PANEL
Calcium: 9.5 mg/dL (ref 8.4–10.5)
Creatinine, Ser: 1.8 mg/dL — ABNORMAL HIGH (ref 0.4–1.2)
GFR: 27.62 mL/min — ABNORMAL LOW (ref >60.00)
Glucose, Bld: 89 mg/dL (ref 70–99)
Sodium: 141 mEq/L (ref 135–145)

## 2011-10-14 NOTE — Assessment & Plan Note (Signed)
The patient has a history of hypercholesterolemia.  She presently is on Crestor 10 mg daily and takes just half of a tablet.  She denies any myalgias.  Blood work is pending.

## 2011-10-14 NOTE — Assessment & Plan Note (Signed)
The patient has a history of pancytopenia and she has declined active treatment of this.  She has not been experiencing any unusual infections.  She's not having any excessive bruising or bleeding.  She has decreased energy which may be secondary to her age and we will see what her hemoglobin is.

## 2011-10-14 NOTE — Progress Notes (Signed)
Kristina Shepherd Date of Birth:  [birth year] [facility] / [facility] [address]   [address] [place], [place]  [address] [phone]           Fax   [phone]  History of Present Illness: This pleasant 77 year old woman is seen for a scheduled followup office visit.  She has a past history of pancytopenia.  She has elected not to pursue any treatment of that.  The straight of essential hypertension and hyperlipidemia.  She does not have any history of ischemic heart disease.  She had a normal Cardiolite stress test in 2004.  She had an echocardiogram in 2004 showing mild mitral regurgitation with normal pulmonary artery pressure and concentric LVH with good systolic function.  When we last saw her 4 months ago she had a cellulitis of the perineal area for which we gave her Cipro and she reports good results.  Current Outpatient Prescriptions  Medication Sig Dispense Refill  . diazepam (VALIUM) 5 MG tablet TAKE ONE TABLET BY MOUTH AT BEDTIME AS NEEDED  100 tablet  0  . dorzolamide-timolol (COSOPT) 22.3-6.8 MG/ML ophthalmic solution 1 drop daily.        . ferrous sulfate 325 (65 FE) MG tablet Take 650 mg by mouth daily with breakfast. Taking bid      . glucosamine-chondroitin 500-400 MG tablet Take 1 tablet by mouth daily.        . hydrochlorothiazide (HYDRODIURIL) 25 MG tablet TAKE ONE TABLET BY MOUTH EVERY DAY  90 tablet  3  . Ibuprofen (ADVIL PO) Take by mouth as needed.        . Multiple Vitamins-Minerals (EYE VITAMINS PO) Take by mouth daily.        Bertram Gala Glycol-Propyl Glycol (SYSTANE OP) Apply to eye daily.        . rosuvastatin (CRESTOR) 10 MG tablet Take 5 mg by mouth daily.          Allergies  Allergen Reactions  . Clindamycin/Lincomycin     nausea  . Crestor (Rosuvastatin Calcium)     Patient Active Problem List  Diagnoses  . Benign hypertensive heart disease without heart failure  . Osteoarthritis  . Pancytopenia  . Cellulitis and abscess of  other specified site    History  Smoking status  . Former Smoker  . Quit date: 05/03/1996  Smokeless tobacco  . Not on file    History  Alcohol Use No    Family History  Problem Relation Age of Onset  . Leukemia Mother   . Heart attack Father   . Heart disease Sister   . Cancer Brother   . Cancer Brother   . Cancer Brother   . Cancer Brother   . Cancer Brother   . Asthma Sister   . Asthma Sister     Review of Systems: Constitutional: no fever chills diaphoresis or fatigue or change in weight.  Head and neck: no hearing loss, no epistaxis, no photophobia or visual disturbance. Respiratory: No cough, shortness of breath or wheezing. Cardiovascular: No chest pain peripheral edema, palpitations. Gastrointestinal: No abdominal distention, no abdominal pain, no change in bowel habits hematochezia or melena. Genitourinary: No dysuria, no frequency, no urgency, no nocturia. Musculoskeletal:No arthralgias, no back pain, no gait disturbance or myalgias. Neurological: No dizziness, no headaches, no numbness, no seizures, no syncope, no weakness, no tremors. Hematologic: No lymphadenopathy, no easy bruising. Psychiatric: No confusion, no hallucinations, no sleep disturbance.    Physical Exam: Filed Vitals:  10/14/11 0924  BP: 128/78  Pulse: 60   The general appearance reveals a well-developed well-nourished elderly woman in no distress.Pupils equal and reactive.   Extraocular Movements are full.  There is no scleral icterus.  The mouth and pharynx are normal.  The neck is supple.  The carotids reveal no bruits.  The jugular venous pressure is normal.  The thyroid is not enlarged.  There is no lymphadenopathy.  The chest is clear to percussion and auscultation. There are no rales or rhonchi. Expansion of the chest is symmetrical.  The precordium is quiet.  The first heart sound is normal.  The second heart sound is physiologically split.  There is no murmur gallop rub or  click.  There is no abnormal lift or heave.  The abdomen is soft and nontender. Bowel sounds are normal. The liver and spleen are not enlarged. There Are no abdominal masses. There are no bruits.  Normal extremity she does have an early Dupuytren's contracture of the right hand.  She does have trace to 1+ pedal edema bilaterally.The skin is warm and dry.  There is no rash. Strength is normal and symmetrical in all extremities.  There is no lateralizing weakness.  There are no sensory deficits.    Assessment / Plan: Continue same medication.  She was encouraged by the fact that her oxygen saturation was good on room air.  She'll return in 4 months for a followup office visit and fasting lab work and CBC

## 2011-10-14 NOTE — Patient Instructions (Signed)
Your physician recommends that you continue on your current medications as directed. Please refer to the Current Medication list given to you today. Your physician wants you to follow-up in: 4 months You will receive a reminder letter in the mail two months in advance. If you don't receive a letter, please call our office to schedule the follow-up appointment.  

## 2011-10-14 NOTE — Assessment & Plan Note (Addendum)
The patient is not having any  dizziness.  She does have chronic dyspnea.  Her oxygen saturation on room air today is 93% at rest.  The patient denies any chest pain.  He has not been having any palpitations or tachycardia.  No syncope.

## 2011-10-16 NOTE — Progress Notes (Signed)
lmtcb

## 2011-10-19 ENCOUNTER — Telehealth: Payer: Self-pay | Admitting: *Deleted

## 2011-10-19 NOTE — Telephone Encounter (Signed)
Message copied by Burnell Blanks on Mon Oct 19, 2011  2:59 PM ------      Message from: Cassell Clement      Created: Wed Oct 14, 2011  7:12 PM       Please report.  The cholesterol and lipids are fine.  The liver tests are fine.  The CBC shows that her white cell count, her hemoglobin, and her platelets are all lower than last time consistent with her known diagnosis of pancytopenia.  Continue observation.  I would avoid any aspirin products since her platelet count is low.

## 2011-10-19 NOTE — Telephone Encounter (Signed)
Advised daughter of labs 

## 2011-12-28 ENCOUNTER — Telehealth: Payer: Self-pay | Admitting: Cardiology

## 2011-12-28 NOTE — Telephone Encounter (Signed)
New msg Pt's daughter called about crestor samples. Please call her back

## 2011-12-28 NOTE — Telephone Encounter (Signed)
Samples of Crestor 10 mg 1/2 daily #21 lot #[id number] exp 4/15. Advised daughter

## 2012-02-09 ENCOUNTER — Telehealth: Payer: Self-pay | Admitting: Cardiology

## 2012-02-09 MED ORDER — ROSUVASTATIN CALCIUM 10 MG PO TABS: 5.0000 mg | ORAL_TABLET | Freq: Every day | ORAL | Status: DC

## 2012-02-09 NOTE — Telephone Encounter (Signed)
New msg Pt's daughter called to get more samples of crestor

## 2012-02-09 NOTE — Telephone Encounter (Signed)
We do not have any samples at this time.  

## 2012-03-03 ENCOUNTER — Other Ambulatory Visit: Payer: Self-pay

## 2012-03-03 ENCOUNTER — Telehealth: Payer: Self-pay | Admitting: Cardiology

## 2012-03-03 DIAGNOSIS — F419 Anxiety disorder, unspecified: Secondary | ICD-10-CM

## 2012-03-03 MED ORDER — DIAZEPAM 5 MG PO TABS: 5.0000 mg | ORAL_TABLET | Freq: Every evening | ORAL | Status: DC | PRN

## 2012-03-03 NOTE — Telephone Encounter (Signed)
New Problem:     Patient called in needing a refill of her diazepam (VALIUM) 5 MG tablet.  Patient would also like to know if we have any samples of rosuvastatin (CRESTOR) 10 MG tablet available for her to have.  Please call back.

## 2012-03-06 ENCOUNTER — Other Ambulatory Visit: Payer: Self-pay | Admitting: Cardiology

## 2012-03-16 ENCOUNTER — Telehealth: Payer: Self-pay | Admitting: Cardiology

## 2012-03-16 NOTE — Telephone Encounter (Signed)
crestor 5 mg # 4 exp 01/16 lot # [id number]  Left message samples ready

## 2012-03-16 NOTE — Telephone Encounter (Signed)
New msg Pt's wants samples of crestor. Please let her know

## 2012-04-11 ENCOUNTER — Ambulatory Visit (INDEPENDENT_AMBULATORY_CARE_PROVIDER_SITE_OTHER): Payer: Medicare Other | Admitting: Cardiology

## 2012-04-11 ENCOUNTER — Encounter: Payer: Self-pay | Admitting: Cardiology

## 2012-04-11 VITALS — BP 138/78 | HR 60 | Ht 62.0 in | Wt 151.0 lb

## 2012-04-11 DIAGNOSIS — E7800 Pure hypercholesterolemia, unspecified: Secondary | ICD-10-CM

## 2012-04-11 DIAGNOSIS — R5381 Other malaise: Secondary | ICD-10-CM

## 2012-04-11 DIAGNOSIS — R5383 Other fatigue: Secondary | ICD-10-CM

## 2012-04-11 DIAGNOSIS — I119 Hypertensive heart disease without heart failure: Secondary | ICD-10-CM

## 2012-04-11 DIAGNOSIS — D61818 Other pancytopenia: Secondary | ICD-10-CM

## 2012-04-11 LAB — LIPID PANEL
HDL: 51.5 mg/dL (ref >39.00)
Triglycerides: 92 mg/dL (ref 0.0–149.0)

## 2012-04-11 LAB — CBC WITH DIFFERENTIAL/PLATELET
Basophils Relative: 0.6 % (ref 0.0–3.0)
Eosinophils Relative: 2.4 % (ref 0.0–5.0)
HCT: 32.6 % — ABNORMAL LOW (ref 36.0–46.0)
Hemoglobin: 10.4 g/dL — ABNORMAL LOW (ref 12.0–15.0)
Lymphs Abs: 0.9 10*3/uL (ref 0.7–4.0)
MCV: 77.6 fl — ABNORMAL LOW (ref 78.0–100.0)
Monocytes Absolute: 0.9 10*3/uL (ref 0.1–1.0)
RBC: 4.21 Mil/uL (ref 3.87–5.11)
WBC: 3.9 10*3/uL — ABNORMAL LOW (ref 4.5–10.5)

## 2012-04-11 LAB — HEPATIC FUNCTION PANEL
Albumin: 4.3 g/dL (ref 3.5–5.2)
Bilirubin, Direct: 0.1 mg/dL (ref 0.0–0.3)
Total Protein: 6.8 g/dL (ref 6.0–8.3)

## 2012-04-11 LAB — BASIC METABOLIC PANEL
BUN: 41 mg/dL — ABNORMAL HIGH (ref 6–23)
Chloride: 101 mEq/L (ref 96–112)
Potassium: 4.1 mEq/L (ref 3.5–5.1)
Sodium: 142 mEq/L (ref 135–145)

## 2012-04-11 NOTE — Patient Instructions (Signed)
Will obtain labs today and call you with the results  Your physician recommends that you continue on your current medications as directed. Please refer to the Current Medication list given to you today. Your physician wants you to follow-up in: 4 months  You will receive a reminder letter in the mail two months in advance. If you don't receive a letter, please call our office to schedule the follow-up appointment.  

## 2012-04-11 NOTE — Assessment & Plan Note (Signed)
The patient has a history of hypercholesterolemia without ischemic heart disease and is on long-term Crestor 10 mg daily.  Is not having any side effects from the Crestor.

## 2012-04-11 NOTE — Assessment & Plan Note (Signed)
The patient has not been having any chest pain or angina.  She does have exertional dyspnea which is chronic and unchanged

## 2012-04-11 NOTE — Assessment & Plan Note (Signed)
The patient has a history of pancytopenia.  She previously had seen Dr. Truett Perna but the patient elected not to pursue any medical treatment for the pancytopenia

## 2012-04-11 NOTE — Progress Notes (Signed)
Quick Note:  Please report to patient. The recent labs are stable. Continue same medication and careful diet. The kidneys are too dry. Decrease HCTZ to half a tablet daily and drink more water. ______

## 2012-04-11 NOTE — Progress Notes (Signed)
Kristina Shepherd Date of Birth:  [birth year] [facility] [address] [place], [place]  [address] [phone]         Fax   [phone]  History of Present Illness: This pleasant 78 year old woman is seen for a scheduled four-month followup office visit.  She has a past history of exertional dyspnea.  She has a history of low oxygen levels and her O2 sat today is only 87% here in the office.  She does have home oxygen but has not been using it on a regular basis.  The patient has a history of essential hypertension.  She also has a history of hypercholesterolemia and a history of pancytopenia for which she has elected not to pursue any treatment.  Is not have any history of ischemic heart disease and she had a normal Cardiolite stress test in 2004.  Her echocardiogram in 2004 showed mild mitral regurgitation with normal pulmonary artery pressure and concentric LVH with good LV systolic function.  Current Outpatient Prescriptions  Medication Sig Dispense Refill  . diazepam (VALIUM) 5 MG tablet Take 1 tablet (5 mg total) by mouth at bedtime as needed for anxiety.  100 tablet  1  . ferrous sulfate 325 (65 FE) MG tablet Take 650 mg by mouth daily with breakfast. Taking bid      . glucosamine-chondroitin 500-400 MG tablet Take 1 tablet by mouth daily. As needed      . hydrochlorothiazide (HYDRODIURIL) 25 MG tablet TAKE ONE TABLET BY MOUTH EVERY DAY  90 tablet  3  . Ibuprofen (ADVIL PO) Take by mouth as needed.        . loteprednol (LOTEMAX) 0.2 % SUSP 1 drop 4 (four) times daily.      . Multiple Vitamins-Minerals (EYE VITAMINS PO) Take by mouth daily.        Bertram Gala Glycol-Propyl Glycol (SYSTANE OP) Apply to eye daily.        . rosuvastatin (CRESTOR) 10 MG tablet Take 0.5 tablets (5 mg total) by mouth daily.  15 tablet  10    Allergies  Allergen Reactions  . Clindamycin/Lincomycin     nausea  . Crestor (Rosuvastatin Calcium)     Patient Active Problem List    Diagnoses  . Benign hypertensive heart disease without heart failure  . Osteoarthritis  . Pancytopenia  . Cellulitis and abscess of other specified site  . Hypercholesterolemia    History  Smoking status  . Former Smoker  . Quit date: 05/03/1996  Smokeless tobacco  . Not on file    History  Alcohol Use No    Family History  Problem Relation Age of Onset  . Leukemia Mother   . Heart attack Father   . Heart disease Sister   . Cancer Brother   . Cancer Brother   . Cancer Brother   . Cancer Brother   . Cancer Brother   . Asthma Sister   . Asthma Sister     Review of Systems: Constitutional: no fever chills diaphoresis or fatigue or change in weight.  Head and neck: no hearing loss, no epistaxis, no photophobia or visual disturbance. Respiratory: No cough, shortness of breath or wheezing. Cardiovascular: No chest pain peripheral edema, palpitations. Gastrointestinal: No abdominal distention, no abdominal pain, no change in bowel habits hematochezia or melena. Genitourinary: No dysuria, no frequency, no urgency, no nocturia. Musculoskeletal:No arthralgias, no back pain, no gait disturbance or myalgias. Neurological: No dizziness, no headaches, no numbness, no seizures, no syncope, no  weakness, no tremors. Hematologic: No lymphadenopathy, no easy bruising. Psychiatric: No confusion, no hallucinations, no sleep disturbance.    Physical Exam: Filed Vitals:   04/11/12 1007  BP: 138/78  Pulse: 60   the general appearance reveals a well-developed well-nourished elderly woman in no distress.The head and neck exam reveals pupils equal and reactive.  Extraocular movements are full.  There is no scleral icterus.  The mouth and pharynx are normal.  The neck is supple.  The carotids reveal no bruits.  The jugular venous pressure is normal.  The  thyroid is not enlarged.  There is no lymphadenopathy.  The chest is clear to percussion and auscultation.  There are no rales or  rhonchi.  Expansion of the chest is symmetrical.  The precordium is quiet.  The first heart sound is normal.  The second heart sound is physiologically split.  There is no  gallop rub or click.  There is a soft grade 1/6 systolic ejection murmur at the base  There is no abnormal lift or heave.  The abdomen is soft and nontender.  The bowel sounds are normal.  The liver and spleen are not enlarged.  There are no abdominal masses.  There are no abdominal bruits.  Extremities reveal good pedal pulses.  There is mild bilateral peripheral edema  There is no cyanosis or clubbing.  Strength is normal and symmetrical in all extremities.  There is no lateralizing weakness.  There are no sensory deficits.  The skin is warm and dry.  There is no rash.     Assessment / Plan: Continue same medicines.  Recheck in 4 months for followup office visit and EKG.  Today we are checking blood work including fasting lipid panel chemistries CBC and TSH.  I have encouraged her to use her home oxygen at bedtime when she sleeps.

## 2012-04-12 ENCOUNTER — Telehealth: Payer: Self-pay | Admitting: *Deleted

## 2012-04-12 NOTE — Telephone Encounter (Signed)
Message copied by Burnell Blanks on Tue Apr 12, 2012  1:28 PM ------      Message from: Cassell Clement      Created: Mon Apr 11, 2012  9:47 PM       Please report to patient.  The recent labs are stable. Continue same medication and careful diet. The kidneys are too dry. Decrease HCTZ to half a tablet daily and drink more water.

## 2012-04-12 NOTE — Telephone Encounter (Signed)
Advised daughter of lab results.  

## 2012-05-23 ENCOUNTER — Telehealth: Payer: Self-pay | Admitting: Cardiology

## 2012-05-23 NOTE — Telephone Encounter (Signed)
Samples of Crestor 10 mg 1/2 daily left at front desk

## 2012-05-23 NOTE — Telephone Encounter (Signed)
New msg Pt wants to talk to you about her med-crestor. Please call

## 2012-07-01 ENCOUNTER — Telehealth: Payer: Self-pay | Admitting: Cardiology

## 2012-07-01 NOTE — Telephone Encounter (Signed)
Samples of crestor placed at front desk--daughter aware

## 2012-07-01 NOTE — Telephone Encounter (Signed)
Pt needs samples of crestor, pls call dtr, ok to leave message

## 2012-09-05 ENCOUNTER — Ambulatory Visit (INDEPENDENT_AMBULATORY_CARE_PROVIDER_SITE_OTHER): Payer: Medicare Other | Admitting: Cardiology

## 2012-09-05 ENCOUNTER — Encounter: Payer: Self-pay | Admitting: Cardiology

## 2012-09-05 VITALS — BP 158/78 | HR 51 | Ht 62.0 in | Wt 151.0 lb

## 2012-09-05 DIAGNOSIS — I119 Hypertensive heart disease without heart failure: Secondary | ICD-10-CM

## 2012-09-05 DIAGNOSIS — E7800 Pure hypercholesterolemia, unspecified: Secondary | ICD-10-CM

## 2012-09-05 DIAGNOSIS — L259 Unspecified contact dermatitis, unspecified cause: Secondary | ICD-10-CM

## 2012-09-05 DIAGNOSIS — M1990 Unspecified osteoarthritis, unspecified site: Secondary | ICD-10-CM

## 2012-09-05 DIAGNOSIS — L309 Dermatitis, unspecified: Secondary | ICD-10-CM | POA: Insufficient documentation

## 2012-09-05 LAB — BASIC METABOLIC PANEL
BUN: 37 mg/dL — ABNORMAL HIGH (ref 6–23)
CO2: 30 mEq/L (ref 19–32)
Calcium: 9.6 mg/dL (ref 8.4–10.5)
Creatinine, Ser: 1.7 mg/dL — ABNORMAL HIGH (ref 0.4–1.2)
GFR: 29.82 mL/min — ABNORMAL LOW (ref >60.00)
Glucose, Bld: 85 mg/dL (ref 70–99)

## 2012-09-05 NOTE — Assessment & Plan Note (Signed)
Patient has a history of pancytopenia.  She has elected not to followup with hematology about this.  She has a predisposition for infections.  Presently she has a infection of the right eye and her ophthalmologist has recommended tear duct surgery and she has an appointment to see a specialist at [facility].

## 2012-09-05 NOTE — Assessment & Plan Note (Signed)
Her main complaint today is arthritis of her knees.  She does have ibuprofen which she uses on a when necessary basis.  She also has glucosamine.  She comes to the office in a wheelchair today because of the arthritis and difficulty with ambulation.

## 2012-09-05 NOTE — Patient Instructions (Addendum)
Will obtain labs today and call you with the results (bmet)  Your physician recommends that you continue on your current medications as directed. Please refer to the Current Medication list given to you today.  Your physician recommends that you schedule a follow-up appointment in: 4 months with fasting labs (lp/bmet/hfp)  

## 2012-09-05 NOTE — Progress Notes (Signed)
Quick Note:  Please report to patient. The recent labs are stable. Continue same medication and careful diet. Kidney function is better. ______ 

## 2012-09-05 NOTE — Assessment & Plan Note (Signed)
The patient has a history of high blood pressure.  I rechecked her blood pressure myself and it was 150/70.  She does consume a fair amount of salt particularly by eating salted popcorn in the evenings.  The patient has a history of chronic dependent edema.  The edema goes down at night and she does have nocturia multiple times during the night.

## 2012-09-05 NOTE — Progress Notes (Signed)
Kristina Shepherd Date of Birth:  Sep 28, 1918 [facility] [address] [place], [place]  [address] [phone]         Fax   [phone]  History of Present Illness: This pleasant 78 year old woman is seen for a scheduled four-month followup office visit. She has a past history of exertional dyspnea. She has a history of low oxygen levels and her O2 sat today is up to 93% on room air here in the office. She does have home oxygen but has not been using it on a regular basis. The patient has a history of essential hypertension. She also has a history of hypercholesterolemia and a history of pancytopenia for which she has elected not to pursue any treatment. Is not have any history of ischemic heart disease and she had a normal Cardiolite stress test in 2004. Her echocardiogram in 2004 showed mild mitral regurgitation with normal pulmonary artery pressure and concentric LVH with good LV systolic function.   Current Outpatient Prescriptions  Medication Sig Dispense Refill  . diazepam (VALIUM) 5 MG tablet Take 1 tablet (5 mg total) by mouth at bedtime as needed for anxiety.  100 tablet  1  . ferrous sulfate 325 (65 FE) MG tablet Take 650 mg by mouth daily with breakfast. Taking bid      . glucosamine-chondroitin 500-400 MG tablet Take 1 tablet by mouth daily. As needed      . hydrochlorothiazide (HYDRODIURIL) 25 MG tablet Take 25 mg by mouth. 1/2 tablet daily      . Ibuprofen (ADVIL PO) Take by mouth as needed.        . loteprednol (LOTEMAX) 0.2 % SUSP 1 drop 4 (four) times daily.      Bertram Gala Glycol-Propyl Glycol (SYSTANE OP) Apply to eye daily.        . rosuvastatin (CRESTOR) 10 MG tablet Take 0.5 tablets (5 mg total) by mouth daily.  15 tablet  10  . Multiple Vitamins-Minerals (EYE VITAMINS PO) Take by mouth daily.          Allergies  Allergen Reactions  . Clindamycin/Lincomycin     nausea  . Crestor (Rosuvastatin Calcium)     Patient Active Problem List    Diagnosis  . Benign hypertensive heart disease without heart failure  . Osteoarthritis  . Pancytopenia  . Cellulitis and abscess of other specified site  . Hypercholesterolemia    History  Smoking status  . Former Smoker  . Quit date: 05/03/1996  Smokeless tobacco  . Not on file    History  Alcohol Use No    Family History  Problem Relation Age of Onset  . Leukemia Mother   . Heart attack Father   . Heart disease Sister   . Cancer Brother   . Cancer Brother   . Cancer Brother   . Cancer Brother   . Cancer Brother   . Asthma Sister   . Asthma Sister     Review of Systems: Constitutional: no fever chills diaphoresis or fatigue or change in weight.  Head and neck: no hearing loss, no epistaxis, no photophobia or visual disturbance. Respiratory: No cough, shortness of breath or wheezing. Cardiovascular: No chest pain peripheral edema, palpitations. Gastrointestinal: No abdominal distention, no abdominal pain, no change in bowel habits hematochezia or melena. Genitourinary: No dysuria, no frequency, no urgency, no nocturia. Musculoskeletal:No arthralgias, no back pain, no gait disturbance or myalgias. Neurological: No dizziness, no headaches, no numbness, no seizures, no syncope, no weakness, no  tremors. Hematologic: No lymphadenopathy, no easy bruising. Psychiatric: No confusion, no hallucinations, no sleep disturbance.    Physical Exam: Filed Vitals:   09/05/12 0947  BP: 158/78  Pulse: 51   the general appearance reveals a well-developed elderly woman in no acute distress.  She has lesions of eczema on her face.  She has a loosening of the right lower eyelid causing her to have constant tearing down her face.The head and neck exam reveals pupils equal and reactive.  Extraocular movements are full.  There is no scleral icterus.  The mouth and pharynx are normal.  The neck is supple.  The carotids reveal no bruits.  The jugular venous pressure is normal.  The   thyroid is not enlarged.  There is no lymphadenopathy.  The chest is clear to percussion and auscultation.  There are no rales or rhonchi.  Expansion of the chest is symmetrical.  The precordium is quiet.  The first heart sound is normal.  The second heart sound is physiologically split.  There is no murmur gallop rub or click.  There is no abnormal lift or heave.  The abdomen is soft and nontender.  The bowel sounds are normal.  The liver and spleen are not enlarged.  There are no abdominal masses.  There are no abdominal bruits.  Extremities reveal good pedal pulses.  There is moderate dependent edema bilaterally  There is no cyanosis or clubbing.  Strength is normal and symmetrical in all extremities.  There is no lateralizing weakness.  There are no sensory deficits.  The skin is warm and dry.  There is no rash.   EKG today shows sinus bradycardia with first degree AV block and nonspecific T-wave abnormality.  There are no prior EKGs in Epic  Assessment / Plan: Continue same medication.  Check in 4 months for followup office visit lipid panel basal metabolic panel and hepatic function panel.

## 2012-09-05 NOTE — Assessment & Plan Note (Signed)
The patient has a history of recent skin eruption which appears to be some form of eczema in her ears and on her face.  I have recommended that she see her dermatologist

## 2012-09-05 NOTE — Assessment & Plan Note (Signed)
Patient has a history of hypercholesterolemia and is on Crestor.  She is not having any side effects.

## 2012-09-06 ENCOUNTER — Telehealth: Payer: Self-pay | Admitting: *Deleted

## 2012-09-06 NOTE — Telephone Encounter (Signed)
Message copied by Burnell Blanks on Tue Sep 06, 2012  2:03 PM ------      Message from: Cassell Clement      Created: Mon Sep 05, 2012  8:39 PM       Please report to patient.  The recent labs are stable. Continue same medication and careful diet. Kidney function is better.

## 2012-09-06 NOTE — Telephone Encounter (Signed)
Left message on daughters phone, no changes continue same dose of medications

## 2012-11-11 ENCOUNTER — Other Ambulatory Visit: Payer: Self-pay | Admitting: *Deleted

## 2012-11-11 DIAGNOSIS — F419 Anxiety disorder, unspecified: Secondary | ICD-10-CM

## 2012-11-11 MED ORDER — DIAZEPAM 5 MG PO TABS: 5.0000 mg | ORAL_TABLET | Freq: Every evening | ORAL | Status: DC | PRN

## 2012-12-02 ENCOUNTER — Telehealth: Payer: Self-pay | Admitting: Cardiology

## 2012-12-02 NOTE — Telephone Encounter (Signed)
Left message no samples

## 2012-12-02 NOTE — Telephone Encounter (Signed)
New Problem      Refill Request Calling to request Crestor 10 mg samples

## 2012-12-06 ENCOUNTER — Telehealth: Payer: Self-pay | Admitting: Cardiology

## 2012-12-06 NOTE — Telephone Encounter (Signed)
Advised daughter no samples at this time. She will call [facility] to see if she can get chair there. Will call back to let us know if Rx can be faxed

## 2012-12-06 NOTE — Telephone Encounter (Signed)
New Problem:    Patient's daughter called in wanting to request a transport chair for her mother and also wanted to know if there were any samples of rosuvastatin (CRESTOR) 10 MG tablet available for her to have.  Please call back.

## 2012-12-08 ENCOUNTER — Telehealth: Payer: Self-pay | Admitting: Cardiology

## 2012-12-08 NOTE — Telephone Encounter (Signed)
ORDER WRITTEN AND FAXED TO  NUMBER LISTED IN MESSAGE ALSO CRESTOR SAMPLES LEFT AT FRONT DESK FOR DAUGHTER TO  PICK UP .Kristina Shepherd

## 2012-12-08 NOTE — Telephone Encounter (Signed)
New problem    Need an order for lift chair - [facility]  Fax #  [phone].

## 2012-12-09 ENCOUNTER — Telehealth: Payer: Self-pay | Admitting: Cardiology

## 2012-12-09 NOTE — Telephone Encounter (Signed)
New problem:    [facility] stating they never receive the fax order on yesterday  - lift chair.     Fax # [phone].   Phone # [phone].

## 2012-12-09 NOTE — Telephone Encounter (Signed)
Resent today, received a second confirmation. Left message with husband

## 2012-12-13 ENCOUNTER — Telehealth: Payer: Self-pay | Admitting: Cardiology

## 2012-12-13 NOTE — Telephone Encounter (Signed)
New Problem   Pts daughter called in stating she has been trying to get a transport chair for her mother but medicare will not approve it without a reason for the transportation. Would like to speak to nurse.

## 2012-12-13 NOTE — Telephone Encounter (Signed)
Will fax with this information

## 2012-12-14 ENCOUNTER — Telehealth: Payer: Self-pay | Admitting: Cardiology

## 2012-12-14 NOTE — Telephone Encounter (Signed)
Scheduled appointment for patient to see  Dr. Patty Sermons

## 2012-12-14 NOTE — Telephone Encounter (Signed)
New Problem    States Dr. Yevonne Pax NPI number was not on the script and the office visit notes do not mention anything about pt needing a transport chair. She is requesting an amendment note.

## 2012-12-23 ENCOUNTER — Encounter: Payer: Self-pay | Admitting: Cardiology

## 2012-12-23 ENCOUNTER — Ambulatory Visit (INDEPENDENT_AMBULATORY_CARE_PROVIDER_SITE_OTHER): Payer: Medicare Other | Admitting: Cardiology

## 2012-12-23 VITALS — BP 150/70 | HR 75 | Ht 62.0 in | Wt 151.0 lb

## 2012-12-23 DIAGNOSIS — M1990 Unspecified osteoarthritis, unspecified site: Secondary | ICD-10-CM

## 2012-12-23 DIAGNOSIS — E7800 Pure hypercholesterolemia, unspecified: Secondary | ICD-10-CM

## 2012-12-23 DIAGNOSIS — I119 Hypertensive heart disease without heart failure: Secondary | ICD-10-CM

## 2012-12-23 NOTE — Patient Instructions (Addendum)
Your physician recommends that you continue on your current medications as directed. Please refer to the Current Medication list given to you today.  Your physician recommends that you schedule a follow-up appointment in: 4 months with fasting labs (lp/bmet/hfp)  

## 2012-12-23 NOTE — Progress Notes (Signed)
Kristina Shepherd Date of Birth:  08-19-1918 [facility] [address] [place], [place]  [address] [phone]         Fax   [phone]  History of Present Illness: This pleasant 78 year old woman is seen for a scheduled four-month followup office visit. She has a past history of exertional dyspnea. She has a history of low oxygen levels and her O2 sat today is up to 93% on room air here in the office. She does have home oxygen but has not been using it on a regular basis. The patient has a history of essential hypertension. She also has a history of hypercholesterolemia and a history of pancytopenia for which she has elected not to pursue any treatment. Is not have any history of ischemic heart disease and she had a normal Cardiolite stress test in 2004. Her echocardiogram in 2004 showed mild mitral regurgitation with normal pulmonary artery pressure and concentric LVH with good LV systolic function. The patient comes in today because of problems with ambulation.  She was seen for evaluation for a transport chair he needs a high strength light weight transport wheelchair.  She suffers from osteoporosis and osteoarthritis which impairs her ability to walk except for very short distances using a walker at home she has a lot of pain in her back and her legs because of this a walker does not resolve her issue of performing activities of daily living the transport wheelchair will allow her to safely perform these daily activities she will be able to self propel the wheelchair while engaging in activities such as meals and toileting.   Current Outpatient Prescriptions  Medication Sig Dispense Refill  . diazepam (VALIUM) 5 MG tablet Take 1 tablet (5 mg total) by mouth at bedtime as needed for anxiety.  100 tablet  1  . ferrous sulfate 325 (65 FE) MG tablet Take 650 mg by mouth daily with breakfast. Taking bid      . glucosamine-chondroitin 500-400 MG tablet Take 1 tablet by mouth  daily. As needed      . Ibuprofen (ADVIL PO) Take by mouth as needed.        . loteprednol (LOTEMAX) 0.2 % SUSP 1 drop 4 (four) times daily.      . Multiple Vitamins-Minerals (EYE VITAMINS PO) Take by mouth daily.        Bertram Gala Glycol-Propyl Glycol (SYSTANE OP) Apply to eye daily.        . rosuvastatin (CRESTOR) 10 MG tablet Take 0.5 tablets (5 mg total) by mouth daily.  15 tablet  10  . hydrochlorothiazide (HYDRODIURIL) 25 MG tablet Take 25 mg by mouth. 1/2 tablet daily       No current facility-administered medications for this visit.    Allergies  Allergen Reactions  . Clindamycin/Lincomycin     nausea  . Crestor (Rosuvastatin Calcium)     Patient Active Problem List  Diagnosis  . Benign hypertensive heart disease without heart failure  . Osteoarthritis  . Pancytopenia  . Cellulitis and abscess of other specified site  . Hypercholesterolemia  . Eczema    History  Smoking status  . Former Smoker  . Quit date: 05/03/1996  Smokeless tobacco  . Not on file    History  Alcohol Use No    Family History  Problem Relation Age of Onset  . Leukemia Mother   . Heart attack Father   . Heart disease Sister   . Cancer Brother   . Cancer  Brother   . Cancer Brother   . Cancer Brother   . Cancer Brother   . Asthma Sister   . Asthma Sister     Review of Systems: Constitutional: no fever chills diaphoresis or fatigue or change in weight.  Head and neck: no hearing loss, no epistaxis, no photophobia or visual disturbance. Respiratory: No cough, shortness of breath or wheezing. Cardiovascular: No chest pain peripheral edema, palpitations. Gastrointestinal: No abdominal distention, no abdominal pain, no change in bowel habits hematochezia or melena. Genitourinary: No dysuria, no frequency, no urgency, no nocturia. Musculoskeletal:No arthralgias, no back pain, no gait disturbance or myalgias. Neurological: No dizziness, no headaches, no numbness, no seizures, no  syncope, no weakness, no tremors. Hematologic: No lymphadenopathy, no easy bruising. Psychiatric: No confusion, no hallucinations, no sleep disturbance.    Physical Exam: Filed Vitals:   12/23/12 1354  BP: 150/70  Pulse: 75   the general appearance reveals an elderly woman who is in a wheelchair.The head and neck exam reveals pupils equal and reactive.  Extraocular movements are full.  There is no scleral icterus.  The mouth and pharynx are normal.  The neck is supple.  The carotids reveal no bruits.  The jugular venous pressure is normal.  The  thyroid is not enlarged.  There is no lymphadenopathy.  The chest is clear to percussion and auscultation.  There are no rales or rhonchi.  Expansion of the chest is symmetrical.  The precordium is quiet.  The first heart sound is normal.  The second heart sound is physiologically split.  There is no murmur gallop rub or click.  There is no abnormal lift or heave.  The abdomen is soft and nontender.  The bowel sounds are normal.  The liver and spleen are not enlarged.  There are no abdominal masses.  There are no abdominal bruits.  Extremities reveal good pedal pulses.  There is no phlebitis or edema.  There is no cyanosis or clubbing.  Strength is decreased in the lower extremities because of painful arthritis.  There is no lateralizing weakness.  There are no sensory deficits.  The skin is warm and dry.  There is no rash.     Assessment / Plan: Continue same medication.  I gave her a prescription for a light weight transport wheelchair today. She will be rechecked in 4 months for followup office visit lipid panel hepatic function panel and basal metabolic panel

## 2012-12-28 ENCOUNTER — Telehealth: Payer: Self-pay | Admitting: Cardiology

## 2012-12-28 DIAGNOSIS — M1990 Unspecified osteoarthritis, unspecified site: Secondary | ICD-10-CM

## 2012-12-28 DIAGNOSIS — M810 Age-related osteoporosis without current pathological fracture: Secondary | ICD-10-CM

## 2012-12-28 NOTE — Telephone Encounter (Signed)
Order in system for transportation chair

## 2012-12-28 NOTE — Telephone Encounter (Signed)
She needs to talk you about the wheel chair order she needs one more thing

## 2013-01-04 ENCOUNTER — Ambulatory Visit: Payer: Medicare Other | Admitting: Cardiology

## 2013-01-04 ENCOUNTER — Other Ambulatory Visit: Payer: Medicare Other

## 2013-01-17 ENCOUNTER — Other Ambulatory Visit: Payer: Self-pay | Admitting: *Deleted

## 2013-01-17 MED ORDER — HYDROCHLOROTHIAZIDE 25 MG PO TABS: ORAL_TABLET | ORAL | Status: DC

## 2013-01-17 NOTE — Telephone Encounter (Signed)
Refilled and verified directions with daughter

## 2013-02-07 ENCOUNTER — Telehealth: Payer: Self-pay | Admitting: Cardiology

## 2013-02-07 NOTE — Telephone Encounter (Signed)
Pt needs samples of Crestor - Crestor 10 mg Lot [id number]  And exp 07-2015 # 28 placed out front for pick up. Daughter aware.

## 2013-02-07 NOTE — Telephone Encounter (Signed)
New Prob    Requesting some samples of Crestor. Would like to speak to nurse.

## 2013-02-27 ENCOUNTER — Telehealth: Payer: Self-pay | Admitting: Cardiology

## 2013-02-27 NOTE — Telephone Encounter (Signed)
Last week patient started having dizziness that comes and goes. Patient has been using Dramamine and seems to help. Patient blood pressure 155/71 heart rate 55 146/70 heart rate 54 when checked. Saturday and Sunday were good days per daughter. Last night around 2 am patient up to go to the restroom and very dizzy. Scheduled appointment with  Dr. Patty Sermons but he will not be back in office until 03/03/13. Patient does have ENT suggested seeing him but daughter wanted to just keep visit on 03/03/13 with  Dr. Patty Sermons

## 2013-02-27 NOTE — Telephone Encounter (Signed)
New problem   Pt's daughter stated pt is having dizziness for 1 wk. Please call daughter concerning this

## 2013-03-03 ENCOUNTER — Ambulatory Visit: Payer: Medicare Other | Admitting: Cardiology

## 2013-03-30 ENCOUNTER — Telehealth: Payer: Self-pay | Admitting: Cardiology

## 2013-03-30 NOTE — Telephone Encounter (Signed)
Agree with advice given

## 2013-03-30 NOTE — Telephone Encounter (Signed)
None at this time.   Daughter states that patient is having a lot of leg pain and wonders if coming from Crestor. Advised to hold for 2 weeks and call back and update, verbalized understanding.

## 2013-03-30 NOTE — Telephone Encounter (Signed)
New problem    Pt needs samples of crestor

## 2013-04-11 ENCOUNTER — Ambulatory Visit (INDEPENDENT_AMBULATORY_CARE_PROVIDER_SITE_OTHER): Payer: Medicare Other | Admitting: Cardiology

## 2013-04-11 ENCOUNTER — Encounter: Payer: Self-pay | Admitting: Cardiology

## 2013-04-11 ENCOUNTER — Other Ambulatory Visit: Payer: Self-pay

## 2013-04-11 ENCOUNTER — Telehealth: Payer: Self-pay | Admitting: *Deleted

## 2013-04-11 VITALS — BP 162/68 | HR 50 | Ht 62.0 in | Wt 151.8 lb

## 2013-04-11 DIAGNOSIS — I119 Hypertensive heart disease without heart failure: Secondary | ICD-10-CM

## 2013-04-11 DIAGNOSIS — E7800 Pure hypercholesterolemia, unspecified: Secondary | ICD-10-CM

## 2013-04-11 DIAGNOSIS — L309 Dermatitis, unspecified: Secondary | ICD-10-CM

## 2013-04-11 DIAGNOSIS — R609 Edema, unspecified: Secondary | ICD-10-CM

## 2013-04-11 DIAGNOSIS — L259 Unspecified contact dermatitis, unspecified cause: Secondary | ICD-10-CM

## 2013-04-11 LAB — HEPATIC FUNCTION PANEL
ALT: 13 U/L (ref 0–35)
AST: 20 U/L (ref 0–37)
Alkaline Phosphatase: 42 U/L (ref 39–117)
Bilirubin, Direct: 0.1 mg/dL (ref 0.0–0.3)
Total Protein: 6.6 g/dL (ref 6.0–8.3)

## 2013-04-11 LAB — CBC WITH DIFFERENTIAL/PLATELET
Basophils Absolute: 0 10*3/uL (ref 0.0–0.1)
Eosinophils Relative: 2.7 % (ref 0.0–5.0)
MCV: 77.3 fl — ABNORMAL LOW (ref 78.0–100.0)
Monocytes Absolute: 0.9 10*3/uL (ref 0.1–1.0)
Monocytes Relative: 27.8 % — ABNORMAL HIGH (ref 3.0–12.0)
Neutrophils Relative %: 46.7 % (ref 43.0–77.0)
Platelets: 37 10*3/uL — CL (ref 150.0–400.0)
WBC: 3.3 10*3/uL — ABNORMAL LOW (ref 4.5–10.5)

## 2013-04-11 LAB — BASIC METABOLIC PANEL
BUN: 25 mg/dL — ABNORMAL HIGH (ref 6–23)
Creatinine, Ser: 1.7 mg/dL — ABNORMAL HIGH (ref 0.4–1.2)
GFR: 30.83 mL/min — ABNORMAL LOW (ref >60.00)

## 2013-04-11 LAB — LIPID PANEL: Cholesterol: 138 mg/dL (ref 0–200)

## 2013-04-11 MED ORDER — FUROSEMIDE 20 MG PO TABS: 20.0000 mg | ORAL_TABLET | Freq: Every day | ORAL | Status: DC

## 2013-04-11 MED ORDER — ROSUVASTATIN CALCIUM 10 MG PO TABS: 5.0000 mg | ORAL_TABLET | Freq: Every day | ORAL | Status: DC

## 2013-04-11 NOTE — Telephone Encounter (Signed)
Critical platelet count= 37, I will forward to dr/nurse

## 2013-04-11 NOTE — Progress Notes (Signed)
Kristina Shepherd Date of Birth:  [birth year] [facility] [address] [place], [place]  [address] [phone]         Fax   [phone]  History of Present Illness: This pleasant 79 year old woman is seen for a scheduled four-month followup office visit. She has a past history of exertional dyspnea. She has a history of low oxygen levels . She does have home oxygen but has not been using it on a regular basis. The patient has a history of essential hypertension. She also has a history of hypercholesterolemia and a history of pancytopenia for which she has elected not to pursue any treatment. Is not have any history of ischemic heart disease and she had a normal Cardiolite stress test in 2004. Her echocardiogram in 2004 showed mild mitral regurgitation with normal pulmonary artery pressure and concentric LVH with good LV systolic function.  At her last visit we helped qualify her for a lightweight wheelchair which has helped her considerably in her activities of daily living at home.  Current Outpatient Prescriptions  Medication Sig Dispense Refill  . diazepam (VALIUM) 5 MG tablet Take 1 tablet (5 mg total) by mouth at bedtime as needed for anxiety.  100 tablet  1  . ferrous sulfate 325 (65 FE) MG tablet Take 650 mg by mouth daily with breakfast. Taking bid      . glucosamine-chondroitin 500-400 MG tablet Take 1 tablet by mouth daily. As needed      . Ibuprofen (ADVIL PO) Take by mouth as needed.        . loteprednol (LOTEMAX) 0.2 % SUSP 1 drop 4 (four) times daily.      . Multiple Vitamins-Minerals (EYE VITAMINS PO) Take by mouth daily.        Bertram Gala Glycol-Propyl Glycol (SYSTANE OP) Apply to eye daily.        . rosuvastatin (CRESTOR) 10 MG tablet Take 0.5 tablets (5 mg total) by mouth daily.  15 tablet  10  . furosemide (LASIX) 20 MG tablet Take 1 tablet (20 mg total) by mouth daily.  90 tablet  3   No current facility-administered medications for this visit.     Allergies  Allergen Reactions  . Clindamycin/Lincomycin     nausea  . Crestor (Rosuvastatin Calcium)     Patient Active Problem List   Diagnosis Date Noted  . Peripheral edema 04/11/2013  . Eczema 09/05/2012  . Hypercholesterolemia 10/14/2011  . Benign hypertensive heart disease without heart failure 05/04/2011  . Osteoarthritis 05/04/2011  . Pancytopenia 05/04/2011  . Cellulitis and abscess of other specified site 05/04/2011    History  Smoking status  . Former Smoker  . Quit date: 05/03/1996  Smokeless tobacco  . Not on file    History  Alcohol Use No    Family History  Problem Relation Age of Onset  . Leukemia Mother   . Heart attack Father   . Heart disease Sister   . Cancer Brother   . Cancer Brother   . Cancer Brother   . Cancer Brother   . Cancer Brother   . Asthma Sister   . Asthma Sister     Review of Systems: Constitutional: no fever chills diaphoresis or fatigue or change in weight.  Head and neck: no hearing loss, no epistaxis, no photophobia or visual disturbance. Respiratory: No cough, shortness of breath or wheezing. Cardiovascular: No chest pain peripheral edema, palpitations. Gastrointestinal: No abdominal distention, no abdominal pain, no change in bowel  habits hematochezia or melena. Genitourinary: No dysuria, no frequency, no urgency, no nocturia. Musculoskeletal:No arthralgias, no back pain, no gait disturbance or myalgias. Neurological: No dizziness, no headaches, no numbness, no seizures, no syncope, no weakness, no tremors. Hematologic: No lymphadenopathy, no easy bruising. Psychiatric: No confusion, no hallucinations, no sleep disturbance.    Physical Exam: Filed Vitals:   04/11/13 0854  BP: 162/68  Pulse: 50   general appearance reveals a well-developed well-nourished 79 year old woman in no distress.  She arrives via wheelchair.The head and neck exam reveals pupils equal and reactive.  Extraocular movements are full.   There is no scleral icterus.  The mouth and pharynx are normal.  The neck is supple.  The carotids reveal no bruits.  The jugular venous pressure is normal.  The  thyroid is not enlarged.  There is no lymphadenopathy.  The chest is clear to percussion and auscultation.  There are no rales or rhonchi.  Expansion of the chest is symmetrical.  The precordium is quiet.  The first heart sound is normal.  The second heart sound is physiologically split.  There is no  gallop rub or click.  There is a soft systolic murmur at base. There is no abnormal lift or heave.  The abdomen is soft and nontender.  The bowel sounds are normal.  The liver and spleen are not enlarged.  There are no abdominal masses.  There are no abdominal bruits.  Extremities reveal good pedal pulses.  There is 2+ ankle edema  There is no cyanosis or clubbing.  Strength is normal and symmetrical in all extremities.  There is no lateralizing weakness.  There are no sensory deficits.  The skin is warm and dry.  There is no rash.     Assessment / Plan: Continue same medication except stop HCTZ and began furosemide 20 mg daily.  Lab work today pending.  Recheck in 4 months for office visit CBC lipid panel hepatic function panel and basal metabolic panel.  She has a past history of pancytopenia, untreated.

## 2013-04-11 NOTE — Assessment & Plan Note (Signed)
The patient has been on hydrochlorothiazide 12.5 mg daily.  Her edema has worsened.  She has significant nocturia.  We are going to stop her HCTZ and switch to low dose furosemide 20 mg daily.  We are checking lab work today.

## 2013-04-11 NOTE — Assessment & Plan Note (Signed)
The patient has not been experiencing any angina pectoris.  She has been having increased ankle edema.  Her weight is unchanged.

## 2013-04-11 NOTE — Telephone Encounter (Signed)
The patient has a known blood dyscrasia.  She has had long-standing thrombocytopenia.  In June 2013 her platelet count was 38,000

## 2013-04-11 NOTE — Assessment & Plan Note (Signed)
She has a ringworm-like rash on her face.  This may be tinea capitis.  She will get some over-the-counter Tinactin cream and try it.  If no improvement she will see a dermatologist

## 2013-04-11 NOTE — Assessment & Plan Note (Signed)
The patient has a history of hypercholesterolemia.  She is on Crestor.  She's not having any myalgias.  He does have arthritic pain in her knees which did not change when she stopped Crestor temporarily.

## 2013-04-11 NOTE — Patient Instructions (Addendum)
Will obtain labs today and call you with the results (LP/BMET/HFP/CBC)  STOP YOUR HCTZ (HYDROCHLOROTHIZIDE)   START DAILY, RX SENT TO [facility] LASIX 20 MG DAILY   Your physician wants you to follow-up in: 4 months with fasting labs (lp/bmet/hfp/cbc) You will receive a reminder letter in the mail two months in advance. If you don't receive a letter, please call our office to schedule the follow-up appointment.

## 2013-04-12 NOTE — Progress Notes (Signed)
Quick Note:  Please report to patient. The recent labs are stable. Continue same medication and careful diet. CBC shows stable pancytopenia. ______

## 2013-04-13 NOTE — Telephone Encounter (Signed)
Advised daughter  Notes Recorded by Cassell Clement, MD on 04/12/2013 at 9:47 PM Please report to patient. The recent labs are stable. Continue same medication and careful diet. CBC shows stable pancytopenia.

## 2013-05-02 ENCOUNTER — Telehealth: Payer: Self-pay | Admitting: Cardiology

## 2013-05-02 NOTE — Telephone Encounter (Signed)
crestor 10 mg 1/2 daily 3 packs lot# [id number] exp 1/17, advised daughter

## 2013-05-02 NOTE — Telephone Encounter (Signed)
New Problem  Daughter is wanting to know if she can get some CRESTOR samples for her mom.

## 2013-06-08 ENCOUNTER — Encounter: Payer: Self-pay | Admitting: Cardiology

## 2013-06-09 ENCOUNTER — Encounter: Payer: Self-pay | Admitting: Cardiology

## 2013-06-14 ENCOUNTER — Other Ambulatory Visit: Payer: Self-pay | Admitting: *Deleted

## 2013-06-14 DIAGNOSIS — F419 Anxiety disorder, unspecified: Secondary | ICD-10-CM

## 2013-06-14 MED ORDER — DIAZEPAM 5 MG PO TABS: 5.0000 mg | ORAL_TABLET | Freq: Every evening | ORAL | Status: DC | PRN

## 2013-06-19 ENCOUNTER — Telehealth: Payer: Self-pay | Admitting: Cardiology

## 2013-06-19 NOTE — Telephone Encounter (Signed)
Crestor 10 mg #4 lot [id number] exp 1/17 Advised patient

## 2013-06-19 NOTE — Telephone Encounter (Signed)
New Problem    Pt is out of [facility]??.. and states that melinda gives her samples. Please assist

## 2013-08-07 ENCOUNTER — Telehealth: Payer: Self-pay | Admitting: *Deleted

## 2013-08-07 NOTE — Telephone Encounter (Signed)
Steward Drone called requesting samples of Crestor for patient. #21 left at front desk. Steward Drone aware.

## 2013-08-22 ENCOUNTER — Encounter: Payer: Self-pay | Admitting: Cardiology

## 2013-08-22 ENCOUNTER — Other Ambulatory Visit: Payer: Medicare Other

## 2013-08-22 ENCOUNTER — Ambulatory Visit (INDEPENDENT_AMBULATORY_CARE_PROVIDER_SITE_OTHER): Payer: Medicare Other | Admitting: Cardiology

## 2013-08-22 VITALS — BP 136/78 | HR 60 | Wt 146.0 lb

## 2013-08-22 DIAGNOSIS — IMO0001 Reserved for inherently not codable concepts without codable children: Secondary | ICD-10-CM

## 2013-08-22 DIAGNOSIS — E7800 Pure hypercholesterolemia, unspecified: Secondary | ICD-10-CM

## 2013-08-22 DIAGNOSIS — M1990 Unspecified osteoarthritis, unspecified site: Secondary | ICD-10-CM

## 2013-08-22 DIAGNOSIS — M791 Myalgia, unspecified site: Secondary | ICD-10-CM | POA: Insufficient documentation

## 2013-08-22 DIAGNOSIS — I119 Hypertensive heart disease without heart failure: Secondary | ICD-10-CM

## 2013-08-22 DIAGNOSIS — R609 Edema, unspecified: Secondary | ICD-10-CM

## 2013-08-22 LAB — HEPATIC FUNCTION PANEL
Alkaline Phosphatase: 45 U/L (ref 39–117)
Bilirubin, Direct: 0.1 mg/dL (ref 0.0–0.3)

## 2013-08-22 LAB — CBC WITH DIFFERENTIAL/PLATELET
Basophils Relative: 0.4 % (ref 0.0–3.0)
Eosinophils Absolute: 0.2 10*3/uL (ref 0.0–0.7)
MCHC: 32.1 g/dL (ref 30.0–36.0)
MCV: 76.3 fl — ABNORMAL LOW (ref 78.0–100.0)
Monocytes Absolute: 1.2 10*3/uL — ABNORMAL HIGH (ref 0.1–1.0)
Neutrophils Relative %: 49.6 % (ref 43.0–77.0)
Platelets: 35 10*3/uL — CL (ref 150.0–400.0)

## 2013-08-22 LAB — LIPID PANEL
Cholesterol: 132 mg/dL (ref 0–200)
LDL Cholesterol: 58 mg/dL (ref 0–99)
Total CHOL/HDL Ratio: 3

## 2013-08-22 LAB — BASIC METABOLIC PANEL
BUN: 57 mg/dL — ABNORMAL HIGH (ref 6–23)
CO2: 34 mEq/L — ABNORMAL HIGH (ref 19–32)
Chloride: 94 mEq/L — ABNORMAL LOW (ref 96–112)
Creatinine, Ser: 2.3 mg/dL — ABNORMAL HIGH (ref 0.4–1.2)
Glucose, Bld: 88 mg/dL (ref 70–99)

## 2013-08-22 NOTE — Progress Notes (Signed)
Kristina Shepherd Date of Birth:  10/14/1918 [address] [facility] [address] [place], [place]  [address] [phone]         Fax   [phone]  History of Present Illness: This pleasant 79 year old woman is seen for a scheduled four-month followup office visit. She has a past history of exertional dyspnea. She has a history of low oxygen levels .  She previously had home oxygen but she sent it back because she was not using it The patient has a history of essential hypertension. She also has a history of hypercholesterolemia and a history of pancytopenia for which she has elected not to pursue any treatment. Is not have any history of ischemic heart disease and she had a normal Cardiolite stress test in 2004. Her echocardiogram in 2004 showed mild mitral regurgitation with normal pulmonary artery pressure and concentric LVH with good LV systolic function.  She is sedentary and uses a lightweight wheelchair which has helped her considerably in her activities of daily living at home. Recently she has been experiencing some myalgias of her legs.  We are going to stop her Crestor and observe whether the myalgias improve.  Current Outpatient Prescriptions  Medication Sig Dispense Refill  . diazepam (VALIUM) 5 MG tablet Take 1 tablet (5 mg total) by mouth at bedtime as needed for anxiety.  100 tablet  1  . ferrous sulfate 325 (65 FE) MG tablet Take 650 mg by mouth daily with breakfast. Taking bid      . furosemide (LASIX) 20 MG tablet Take 1 tablet (20 mg total) by mouth daily.  90 tablet  3  . glucosamine-chondroitin 500-400 MG tablet Take 1 tablet by mouth daily. As needed      . Ibuprofen (ADVIL PO) Take by mouth as needed.        . loteprednol (LOTEMAX) 0.2 % SUSP 1 drop 4 (four) times daily.      . Multiple Vitamins-Minerals (EYE VITAMINS PO) Take by mouth daily.        Bertram Gala Glycol-Propyl Glycol (SYSTANE OP) Apply to eye daily.         No current facility-administered medications for  this visit.    Allergies  Allergen Reactions  . Clindamycin/Lincomycin     nausea  . Crestor [Rosuvastatin Calcium]     Patient Active Problem List   Diagnosis Date Noted  . Myalgia 08/22/2013  . Peripheral edema 04/11/2013  . Eczema 09/05/2012  . Hypercholesterolemia 10/14/2011  . Benign hypertensive heart disease without heart failure 05/04/2011  . Osteoarthritis 05/04/2011  . Pancytopenia 05/04/2011  . Cellulitis and abscess of other specified site 05/04/2011    History  Smoking status  . Former Smoker  . Quit date: 05/03/1996  Smokeless tobacco  . Not on file    History  Alcohol Use No    Family History  Problem Relation Age of Onset  . Leukemia Mother   . Heart attack Father   . Heart disease Sister   . Cancer Brother   . Cancer Brother   . Cancer Brother   . Cancer Brother   . Cancer Brother   . Asthma Sister   . Asthma Sister     Review of Systems: Constitutional: no fever chills diaphoresis or fatigue or change in weight.  Head and neck: no hearing loss, no epistaxis, no photophobia or visual disturbance. Respiratory: No cough, shortness of breath or wheezing. Cardiovascular: No chest pain peripheral edema, palpitations. Gastrointestinal: No abdominal distention, no abdominal pain,  no change in bowel habits hematochezia or melena. Genitourinary: No dysuria, no frequency, no urgency, no nocturia. Musculoskeletal:No arthralgias, no back pain, no gait disturbance or myalgias. Neurological: No dizziness, no headaches, no numbness, no seizures, no syncope, no weakness, no tremors. Hematologic: No lymphadenopathy, no easy bruising. Psychiatric: No confusion, no hallucinations, no sleep disturbance.    Physical Exam: Filed Vitals:   08/22/13 1148  BP: 136/78  Pulse: 60   general appearance reveals a well-developed well-nourished 79 year old woman in no distress.  She arrives via wheelchair.The head and neck exam reveals pupils equal and reactive.   Extraocular movements are full.  There is no scleral icterus.  The mouth and pharynx are normal.  The neck is supple.  The carotids reveal no bruits.  The jugular venous pressure is normal.  The  thyroid is not enlarged.  There is no lymphadenopathy.  The chest is clear to percussion and auscultation.  There are no rales or rhonchi.  Expansion of the chest is symmetrical.  The precordium is quiet.  The first heart sound is normal.  The second heart sound is physiologically split.  There is no  gallop rub or click.  There is a soft systolic murmur at base. There is no abnormal lift or heave.  The abdomen is soft and nontender.  The bowel sounds are normal.  The liver and spleen are not enlarged.  There are no abdominal masses.  There are no abdominal bruits.  Extremities reveal good pedal pulses.  There is 2+ ankle edema  There is no cyanosis or clubbing.  Strength is normal and symmetrical in all extremities.  There is no lateralizing weakness.  There are no sensory deficits.  The skin is warm and dry.  There is no rash.     Assessment / Plan: Her oxygen level today is 94% which is satisfactory on room air.  We are stopping Crestor.  She will be getting labs today.  Recheck in 4 months for office visit and EKG and hepatic function panel nasal metabolic panel and lipid panel

## 2013-08-22 NOTE — Assessment & Plan Note (Signed)
We will leave off her Crestor until next visit.  Observe over the next several months with a leg pain improves.  We are checking lab work today and we will check it again in 4 months.

## 2013-08-22 NOTE — Patient Instructions (Signed)
Will obtain labs today and call you with the results (CBC/BMET/LP/HFP)  CONTINUE OFF CRESTOR  Your physician wants you to follow-up in: 4 months OV/EKG/BMET You will receive a reminder letter in the mail two months in advance. If you don't receive a letter, please call our office to schedule the follow-up appointment.

## 2013-08-22 NOTE — Progress Notes (Signed)
Quick Note:  Please report to patient. The recent labs are stable. Continue same medication and careful diet. The platelets are still low.  Kidneys are too dry since switching to lasix 20 mg daily. Decrease to just Mon-Wed-Fri and drink plenty of water. ______

## 2013-08-22 NOTE — Assessment & Plan Note (Signed)
Patient has a history of pancytopenia.  We are checking CBC today.  She has seen hematology but has elected not to pursue any treatment of her chronic anemia and pancytopenia.

## 2013-08-22 NOTE — Assessment & Plan Note (Signed)
The patient has intermittent peripheral edema.  The edema is better since we switched her to furosemide.. the patient does enjoy eating salty snack foods.

## 2013-08-22 NOTE — Assessment & Plan Note (Signed)
Blood pressure initially elevated on arrival but subsequently blood pressure returns to acceptable levels.  No headaches dizzy spells or syncope.  No symptoms of CHF.

## 2013-08-23 ENCOUNTER — Telehealth: Payer: Self-pay | Admitting: *Deleted

## 2013-08-23 NOTE — Telephone Encounter (Signed)
Advised daughter 

## 2013-08-23 NOTE — Telephone Encounter (Signed)
Message copied by Burnell Blanks on Wed Aug 23, 2013  5:34 PM ------      Message from: Cassell Clement      Created: Tue Aug 22, 2013  8:40 PM       Please report to patient.  The recent labs are stable. Continue same medication and careful diet. The platelets are still low.       Kidneys are too dry since switching to lasix 20 mg daily. Decrease to just Mon-Wed-Fri and drink plenty of water. ------

## 2013-11-17 ENCOUNTER — Telehealth: Payer: Self-pay | Admitting: Cardiology

## 2013-11-17 NOTE — Telephone Encounter (Signed)
New message  Patients daughter is requesting a RX for a lift chair. Please call and advise.

## 2013-11-17 NOTE — Telephone Encounter (Signed)
Will have  Dr. Patty SermonsBrackbill sign when he gets back 11/27/13, daughter aware

## 2013-11-22 ENCOUNTER — Telehealth: Payer: Self-pay | Admitting: Cardiology

## 2013-11-22 NOTE — Telephone Encounter (Signed)
New message    Want to know if Dr Patty SermonsBrackbill has ok'd for pt to get a liftchair

## 2013-11-22 NOTE — Telephone Encounter (Signed)
Spoke with daughter and explained again  Dr. Patty SermonsBrackbill not back in the office until 11/27/13, will have him sign Rx then

## 2013-11-27 NOTE — Telephone Encounter (Signed)
Advised daughter 

## 2013-12-15 ENCOUNTER — Telehealth: Payer: Self-pay | Admitting: Cardiology

## 2013-12-15 NOTE — Telephone Encounter (Signed)
Discuss with Selena BattenKim in medical records next week

## 2013-12-15 NOTE — Telephone Encounter (Signed)
Follow Up    Following up on paperwork for lift chair. Please call.

## 2013-12-21 NOTE — Telephone Encounter (Signed)
Spoke with daughter and she will ask them to resend

## 2014-02-12 ENCOUNTER — Other Ambulatory Visit: Payer: Medicare Other

## 2014-02-12 ENCOUNTER — Ambulatory Visit (INDEPENDENT_AMBULATORY_CARE_PROVIDER_SITE_OTHER): Payer: Medicare Other | Admitting: Cardiology

## 2014-02-12 ENCOUNTER — Encounter: Payer: Self-pay | Admitting: Cardiology

## 2014-02-12 VITALS — BP 132/58 | HR 51 | Ht 62.0 in | Wt 145.0 lb

## 2014-02-12 DIAGNOSIS — D61818 Other pancytopenia: Secondary | ICD-10-CM

## 2014-02-12 DIAGNOSIS — E7800 Pure hypercholesterolemia, unspecified: Secondary | ICD-10-CM

## 2014-02-12 DIAGNOSIS — I119 Hypertensive heart disease without heart failure: Secondary | ICD-10-CM

## 2014-02-12 DIAGNOSIS — R609 Edema, unspecified: Secondary | ICD-10-CM

## 2014-02-12 LAB — BASIC METABOLIC PANEL
BUN: 45 mg/dL — AB (ref 6–23)
CALCIUM: 9.5 mg/dL (ref 8.4–10.5)
CO2: 29 meq/L (ref 19–32)
CREATININE: 2.1 mg/dL — AB (ref 0.4–1.2)
Chloride: 99 mEq/L (ref 96–112)
GFR: 23.56 mL/min — ABNORMAL LOW (ref >60.00)
Glucose, Bld: 75 mg/dL (ref 70–99)
Potassium: 3.6 mEq/L (ref 3.5–5.1)
Sodium: 139 mEq/L (ref 135–145)

## 2014-02-12 LAB — LIPID PANEL
CHOL/HDL RATIO: 4
Cholesterol: 186 mg/dL (ref 0–200)
HDL: 48.8 mg/dL (ref >39.00)
LDL Cholesterol: 116 mg/dL — ABNORMAL HIGH (ref 0–99)
Triglycerides: 106 mg/dL (ref 0.0–149.0)
VLDL: 21.2 mg/dL (ref 0.0–40.0)

## 2014-02-12 LAB — HEPATIC FUNCTION PANEL
ALK PHOS: 41 U/L (ref 39–117)
ALT: 10 U/L (ref 0–35)
AST: 22 U/L (ref 0–37)
Albumin: 3.7 g/dL (ref 3.5–5.2)
BILIRUBIN DIRECT: 0.1 mg/dL (ref 0.0–0.3)
BILIRUBIN TOTAL: 0.7 mg/dL (ref 0.3–1.2)
Total Protein: 6.6 g/dL (ref 6.0–8.3)

## 2014-02-12 LAB — CBC WITH DIFFERENTIAL/PLATELET
HCT: 33.4 % — ABNORMAL LOW (ref 36.0–46.0)
HEMOGLOBIN: 10.6 g/dL — AB (ref 12.0–15.0)
MCHC: 31.6 g/dL (ref 30.0–36.0)
MCV: 77.3 fl — ABNORMAL LOW (ref 78.0–100.0)
Platelets: 58 10*3/uL — ABNORMAL LOW (ref 150.0–400.0)
RBC: 4.32 Mil/uL (ref 3.87–5.11)
RDW: 15.4 % — ABNORMAL HIGH (ref 11.5–14.6)
WBC: 3.7 10*3/uL — ABNORMAL LOW (ref 4.5–10.5)

## 2014-02-12 NOTE — Progress Notes (Signed)
Kristina Shepherd Date of Birth:  1918-01-30 [address] [place], [place]  [id number] [phone]         Fax   [phone]  History of Present Illness: This pleasant 80 year old woman is seen for a scheduled four-month followup office visit. She has a past history of exertional dyspnea. She has a history of low oxygen levels .  She previously had home oxygen but she sent it back because she was not using it The patient has a history of essential hypertension. She also has a history of hypercholesterolemia and a history of pancytopenia for which she has elected not to pursue any treatment.  She does not have any history of ischemic heart disease and she had a normal Cardiolite stress test in 2004. Her echocardiogram in 2004 showed mild mitral regurgitation with normal pulmonary artery pressure and concentric LVH with good LV systolic function.  She is sedentary and uses a lightweight wheelchair which has helped her considerably in her activities of daily living at home. She has a prior history of myalgias possibly secondary to Crestor  Current Outpatient Prescriptions  Medication Sig Dispense Refill  . diazepam (VALIUM) 5 MG tablet Take 1 tablet (5 mg total) by mouth at bedtime as needed for anxiety.  100 tablet  1  . ferrous sulfate 325 (65 FE) MG tablet Take 650 mg by mouth daily with breakfast. Taking bid      . furosemide (LASIX) 20 MG tablet Take 20 mg by mouth as directed. Monday, Wednesday, and Friday only      . glucosamine-chondroitin 500-400 MG tablet Take 1 tablet by mouth daily. As needed      . Ibuprofen (ADVIL PO) Take by mouth as needed.        . loteprednol (LOTEMAX) 0.2 % SUSP 1 drop 4 (four) times daily.      . Multiple Vitamins-Minerals (EYE VITAMINS PO) Take by mouth daily.        Bertram Gala. Polyethyl Glycol-Propyl Glycol (SYSTANE OP) Apply to eye daily.         No current facility-administered medications for this visit.    Allergies  Allergen Reactions  .  Clindamycin/Lincomycin     nausea  . Crestor [Rosuvastatin Calcium]     Patient Active Problem List   Diagnosis Date Noted  . Myalgia 08/22/2013  . Peripheral edema 04/11/2013  . Eczema 09/05/2012  . Hypercholesterolemia 10/14/2011  . Benign hypertensive heart disease without heart failure 05/04/2011  . Osteoarthritis 05/04/2011  . Pancytopenia 05/04/2011  . Cellulitis and abscess of other specified site 05/04/2011    History  Smoking status  . Former Smoker  . Quit date: 05/03/1996  Smokeless tobacco  . Not on file    History  Alcohol Use No    Family History  Problem Relation Age of Onset  . Leukemia Mother   . Heart attack Father   . Heart disease Sister   . Cancer Brother   . Cancer Brother   . Cancer Brother   . Cancer Brother   . Cancer Brother   . Asthma Sister   . Asthma Sister     Review of Systems: Constitutional: no fever chills diaphoresis or fatigue or change in weight.  Head and neck: no hearing loss, no epistaxis, no photophobia or visual disturbance. Respiratory: No cough, shortness of breath or wheezing. Cardiovascular: No chest pain peripheral edema, palpitations. Gastrointestinal: No abdominal distention, no abdominal pain, no change in bowel habits hematochezia or melena. Genitourinary:  No dysuria, no frequency, no urgency, no nocturia. Musculoskeletal:No arthralgias, no back pain, no gait disturbance or myalgias. Neurological: No dizziness, no headaches, no numbness, no seizures, no syncope, no weakness, no tremors. Hematologic: No lymphadenopathy, no easy bruising. Psychiatric: No confusion, no hallucinations, no sleep disturbance.    Physical Exam: Filed Vitals:   02/12/14 0807  BP: 132/58  Pulse: 51   general appearance reveals a well-developed well-nourished 79 year old woman in no distress.  She arrives via wheelchair.The head and neck exam reveals pupils equal and reactive.  Extraocular movements are full.  There is no scleral  icterus.  The mouth and pharynx are normal.  The neck is supple.  The carotids reveal no bruits.  The jugular venous pressure is normal.  The  thyroid is not enlarged.  There is no lymphadenopathy.  The chest is clear to percussion and auscultation.  There are no rales or rhonchi.  Expansion of the chest is symmetrical.  The precordium is quiet.  The first heart sound is normal.  The second heart sound is physiologically split.  There is no  gallop rub or click.  There is a soft systolic murmur at base. There is no abnormal lift or heave.  The abdomen is soft and nontender.  The bowel sounds are normal.  The liver and spleen are not enlarged.  There are no abdominal masses.  There are no abdominal bruits.  Extremities reveal good pedal pulses.  There is 2+ ankle edema  There is no cyanosis or clubbing.  Strength is normal and symmetrical in all extremities.  There is no lateralizing weakness.  There are no sensory deficits.  The skin is warm and dry.  There is no rash.  EKG today shows sinus bradycardia with first degree AV block.  The inferior T wave inversions have increased since last visit.   Assessment / Plan:  Continue same medication.  Recheck in 4 months for office visit, basal metabolic panel, and CBC.  Await results of labs to see how she is doing off Crestor.

## 2014-02-12 NOTE — Patient Instructions (Signed)
Will obtain labs today and call you with the results (bmet/hfp/lp)  Your physician recommends that you continue on your current medications as directed. Please refer to the Current Medication list given to you today.  Your physician wants you to follow-up in: 4 month ov/bmet/cbc You will receive a reminder letter in the mail two months in advance. If you don't receive a letter, please call our office to schedule the follow-up appointment.

## 2014-02-12 NOTE — Progress Notes (Signed)
Quick Note:  Please report to patient. The recent labs are stable. Continue same medication and careful diet. The cholesterol and LDL are higher since we stopped the statin therapy last visit. Work harder on diet. Continue current medication. ______

## 2014-02-12 NOTE — Assessment & Plan Note (Signed)
She is not having any symptoms from her pancytopenia.  We are rechecking labs today.

## 2014-02-12 NOTE — Assessment & Plan Note (Signed)
She has not been having any significant peripheral edema.  She is on furosemide every other day.

## 2014-02-12 NOTE — Assessment & Plan Note (Signed)
She denies any recent chest pain or shortness of breath

## 2014-02-12 NOTE — Assessment & Plan Note (Signed)
We are checking lab work today.  She is not currently experiencing any myalgias off statin therapy

## 2014-02-15 ENCOUNTER — Other Ambulatory Visit: Payer: Self-pay | Admitting: *Deleted

## 2014-02-15 DIAGNOSIS — F419 Anxiety disorder, unspecified: Secondary | ICD-10-CM

## 2014-02-15 MED ORDER — DIAZEPAM 5 MG PO TABS: 5.0000 mg | ORAL_TABLET | Freq: Every evening | ORAL | Status: DC | PRN

## 2014-07-24 ENCOUNTER — Telehealth: Payer: Self-pay | Admitting: Cardiology

## 2014-07-24 ENCOUNTER — Ambulatory Visit (INDEPENDENT_AMBULATORY_CARE_PROVIDER_SITE_OTHER): Payer: Medicare Other | Admitting: Cardiology

## 2014-07-24 ENCOUNTER — Encounter: Payer: Self-pay | Admitting: Cardiology

## 2014-07-24 VITALS — BP 160/64 | HR 60 | Ht 62.0 in | Wt 138.0 lb

## 2014-07-24 DIAGNOSIS — E7800 Pure hypercholesterolemia, unspecified: Secondary | ICD-10-CM

## 2014-07-24 DIAGNOSIS — I119 Hypertensive heart disease without heart failure: Secondary | ICD-10-CM

## 2014-07-24 DIAGNOSIS — D61818 Other pancytopenia: Secondary | ICD-10-CM

## 2014-07-24 NOTE — Progress Notes (Addendum)
Kristina Shepherd Date of Birth:  19-May-1918 [facility] [address] [place], [place]  [address] [phone]        Fax   [phone]   History of Present Illness: This pleasant 80 year old woman is seen for a scheduled four-month followup office visit. She has a past history of exertional dyspnea. She has a history of low oxygen levels . She previously had home oxygen but she sent it back because she was not using it.  The patient has a history of essential hypertension. She also has a history of hypercholesterolemia and a history of pancytopenia for which she has elected not to pursue any treatment. She does not have any history of ischemic heart disease and she had a normal Cardiolite stress test in 2004. Her echocardiogram in 2004 showed mild mitral regurgitation with normal pulmonary artery pressure and concentric LVH with good LV systolic function.  She is sedentary and uses a lightweight wheelchair which has helped her considerably in her activities of daily living at home.  She has a history of osteoarthritis of her knees.  She has had some problems with chronic constipation. She has a prior history of myalgias possibly secondary to Crestor   Current Outpatient Prescriptions  Medication Sig Dispense Refill  . diazepam (VALIUM) 5 MG tablet Take 1 tablet (5 mg total) by mouth at bedtime as needed for anxiety.  100 tablet  1  . ferrous sulfate 325 (65 FE) MG tablet Take 650 mg by mouth daily with breakfast. Taking bid      . furosemide (LASIX) 20 MG tablet Take 20 mg by mouth as directed. Monday, Wednesday, and Friday only      . glucosamine-chondroitin 500-400 MG tablet Take 1 tablet by mouth daily. As needed      . Ibuprofen (ADVIL PO) Take by mouth as needed.        . Multiple Vitamins-Minerals (EYE VITAMINS PO) Take by mouth daily.        Bertram Gala Glycol-Propyl Glycol (SYSTANE OP) Apply to eye daily.        Marland Kitchen loteprednol (LOTEMAX) 0.2 % SUSP 1 drop 4  (four) times daily.       No current facility-administered medications for this visit.    Allergies  Allergen Reactions  . Clindamycin/Lincomycin     nausea  . Crestor [Rosuvastatin Calcium]     Patient Active Problem List   Diagnosis Date Noted  . Myalgia 08/22/2013  . Peripheral edema 04/11/2013  . Eczema 09/05/2012  . Hypercholesterolemia 10/14/2011  . Benign hypertensive heart disease without heart failure 05/04/2011  . Osteoarthritis 05/04/2011  . Pancytopenia 05/04/2011  . Cellulitis and abscess of other specified site 05/04/2011    History  Smoking status  . Former Smoker  . Quit date: 05/03/1996  Smokeless tobacco  . Not on file    History  Alcohol Use No    Family History  Problem Relation Age of Onset  . Leukemia Mother   . Heart attack Father   . Heart disease Sister   . Cancer Brother   . Cancer Brother   . Cancer Brother   . Cancer Brother   . Cancer Brother   . Asthma Sister   . Asthma Sister     Review of Systems: Constitutional: no fever chills diaphoresis or fatigue or change in weight.  Head and neck: no hearing loss, no epistaxis, no photophobia or visual disturbance. Respiratory: No cough, shortness of breath or wheezing.  Cardiovascular: No chest pain peripheral edema, palpitations. Gastrointestinal: No abdominal distention, no abdominal pain, no change in bowel habits hematochezia or melena. Genitourinary: No dysuria, no frequency, no urgency, no nocturia. Musculoskeletal:No arthralgias, no back pain, no gait disturbance or myalgias. Neurological: No dizziness, no headaches, no numbness, no seizures, no syncope, no weakness, no tremors. Hematologic: No lymphadenopathy, no easy bruising. Psychiatric: No confusion, no hallucinations, no sleep disturbance.    Physical Exam: Filed Vitals:   07/24/14 0834  BP: 160/64  Pulse: 60  The patient appears to be in no distress.  She is in a wheelchair. Head and neck exam reveals that the  pupils are equal and reactive.  The extraocular movements are full.  There is no scleral icterus.  Mouth and pharynx are benign.  No lymphadenopathy.  No carotid bruits.  The jugular venous pressure is normal.  Thyroid is not enlarged or tender.  Chest is clear to percussion and auscultation.  No rales or rhonchi.  Expansion of the chest is symmetrical.  Heart reveals no abnormal lift or heave.  First and second heart sounds are normal.  There is soft systolic ejection murmur at the base.  The abdomen is soft and nontender.  Bowel sounds are normoactive.  There is no hepatosplenomegaly or mass.  There are no abdominal bruits.  Extremities reveal no phlebitis or edema.  Pedal pulses are good.  There is no cyanosis or clubbing.  Neurologic exam is normal strength and no lateralizing weakness.  No sensory deficits.  Integument reveals no rash    Assessment / Plan: 1.  Essential hypertension without heart failure 2. Hypercholesterolemia 3. history of pancytopenia 4. osteoarthritis of knees  Plan: We're checking a CBC and a basal metabolic panel today.  She will return in 4 months for office visit EKG lipid panel hepatic function panel and basal metabolic panel.  Continue same medication.  Addendum: There was a problem with the blood collection today.  It was noted after the patient had returned home.  It is difficult for the family to bring the patient back to the office.  We will cancel today's labs and just get it next time.

## 2014-07-24 NOTE — Patient Instructions (Signed)
Will obtain labs today and call you with the results (bmet/cbc)  Your physician recommends that you continue on your current medications as directed. Please refer to the Current Medication list given to you today.  Your physician wants you to follow-up in: 4 months with fasting labs (lp/bmet/hfp) and ekg You will receive a reminder letter in the mail two months in advance. If you don't receive a letter, please call our office to schedule the follow-up appointment.

## 2014-07-24 NOTE — Assessment & Plan Note (Addendum)
She has a past history of hypercholesterolemia.  She is not currently on any cholesterol lowering medication.  She previously took Crestor but had an intolerance to it.

## 2014-07-24 NOTE — Telephone Encounter (Signed)
Pt's daughter is calling she has questions about blood work that was ordered today, please call and advise.

## 2014-07-24 NOTE — Telephone Encounter (Signed)
When patient had lab work done today received a call that not enough blood/defective tub and unable to run the labs. Daughter requested that they just be done at next ov since patient has such difficulty getting around. Discussed with  Dr. Brackbill and wiPatty Sermonst check at next [facility]

## 2014-07-24 NOTE — Assessment & Plan Note (Signed)
The patient has a history of pancytopenia.  We are checking a CBC today.  She has elected not to pursue any therapy for this.  Her energy level remains about the same.  She is sedentary

## 2014-07-24 NOTE — Assessment & Plan Note (Signed)
She has not been having any symptoms of heart failure.  She has not had any significant peripheral edema.  She does have occasional isolated palpitations but no sustained arrhythmia.

## 2015-01-08 ENCOUNTER — Ambulatory Visit (INDEPENDENT_AMBULATORY_CARE_PROVIDER_SITE_OTHER): Payer: Medicare Other | Admitting: Cardiology

## 2015-01-08 ENCOUNTER — Other Ambulatory Visit (INDEPENDENT_AMBULATORY_CARE_PROVIDER_SITE_OTHER): Payer: Medicare Other

## 2015-01-08 ENCOUNTER — Encounter: Payer: Self-pay | Admitting: Cardiology

## 2015-01-08 VITALS — BP 160/74 | HR 54 | Ht 62.0 in | Wt 141.0 lb

## 2015-01-08 DIAGNOSIS — I119 Hypertensive heart disease without heart failure: Secondary | ICD-10-CM | POA: Diagnosis not present

## 2015-01-08 DIAGNOSIS — E7800 Pure hypercholesterolemia, unspecified: Secondary | ICD-10-CM

## 2015-01-08 DIAGNOSIS — D61818 Other pancytopenia: Secondary | ICD-10-CM

## 2015-01-08 NOTE — Patient Instructions (Signed)
Will obtain labs today and call you with the results (lp/bmet/hfp/cbc)  Your physician recommends that you continue on your current medications as directed. Please refer to the Current Medication list given to you today.  Your physician wants you to follow-up in: ,6 months with fasting labs (lp/bmet/hfp/cbc)  You will receive a reminder letter in the mail two months in advance. If you don't receive a letter, please call our office to schedule the follow-up appointment.

## 2015-01-08 NOTE — Progress Notes (Signed)
Cardiology Office Note   Date:  01/08/2015   ID:  Kristina PartyRosa L Losee, DOB Mar 07, 1918, MRN [id number]  PCP:  No primary care provider on file.  Cardiologist:   Cassell Clementhomas Miriya Cloer, MD   No chief complaint on file.     History of Present Illness: Kristina Shepherd is a 81 y.o. female who presents for a six-month follow-up office visit.  This pleasant 81 year old woman is seen for a scheduled  followup office visit. She has a past history of exertional dyspnea. She has a history of low oxygen levels . She previously had home oxygen but she sent it back because she was not using it. The patient has a history of essential hypertension. She also has a history of hypercholesterolemia and a history of pancytopenia for which she has elected not to pursue any treatment. She does not have any history of ischemic heart disease and she had a normal Cardiolite stress test in 2004. Her echocardiogram in 2004 showed mild mitral regurgitation with normal pulmonary artery pressure and concentric LVH with good LV systolic function.  She is sedentary and uses a lightweight wheelchair which has helped her considerably in her activities of daily living at home. She has a history of osteoarthritis of her knees. She has had some problems with chronic constipation. She has a prior history of myalgias possibly secondary to Crestor. His last visit she has been doing generally failure.  She has occasional chest discomfort.  It resolves on its own.  She is very sedentary.  She is not troubled with much shortness of breath.  She does have nocturia 4 times a night.  She has a bedside commode.  She is able to fall back readily to sleep.  At home she uses a walker.  She has been experiencing mild peripheral edema.  She has also had some reflux type symptoms with water brash  Past Medical History  Diagnosis Date  . Dehydration   . C. difficile diarrhea   . Arthritis of knee   . Edema of lower extremity   . Fatigue   .  DOE (dyspnea on exertion)   . Nocturia   . Hypertension   . Hyperlipidemia   . OA (osteoarthritis)   . Macular degeneration   . Pancytopenia     Past Surgical History  Procedure Laterality Date  . Dilation and curettage of uterus       Current Outpatient Prescriptions  Medication Sig Dispense Refill  . diazepam (VALIUM) 5 MG tablet Take 1 tablet (5 mg total) by mouth at bedtime as needed for anxiety. 100 tablet 1  . ferrous sulfate 325 (65 FE) MG tablet Take 650 mg by mouth daily with breakfast. Taking bid    . furosemide (LASIX) 20 MG tablet Take 20 mg by mouth as directed. Monday, Wednesday, and Friday only    . glucosamine-chondroitin 500-400 MG tablet Take 1 tablet by mouth daily. As needed    . Ibuprofen (ADVIL PO) Take by mouth as needed.      . loteprednol (LOTEMAX) 0.2 % SUSP 1 drop 4 (four) times daily.    . Multiple Vitamins-Minerals (EYE VITAMINS PO) Take by mouth daily.      Bertram Gala. Polyethyl Glycol-Propyl Glycol (SYSTANE OP) Apply to eye daily.       No current facility-administered medications for this visit.    Allergies:   Clindamycin/lincomycin and Crestor    Social History:  The patient  reports that she quit smoking about 18 years ago.  She does not have any smokeless tobacco history on file. She reports that she does not drink alcohol or use illicit drugs.   Family History:  The patient's family history includes Asthma in her sister and sister; Cancer in her brother, brother, brother, brother, and brother; Heart attack in her father; Heart disease in her sister; Leukemia in her mother.    ROS:  Please see the history of present illness.   Otherwise, review of systems are positive for none.   All other systems are reviewed and negative.    PHYSICAL EXAM: VS:  BP 160/74 mmHg  Pulse 54  Ht  (1.575 m)  Wt 141 lb (63.957 kg)  BMI 25.78 kg/m2 , BMI Body mass index is 25.78 kg/(m^2). GEN: Well nourished, well developed, in no acute distress HEENT:  normal Neck: no JVD, carotid bruits, or masses Cardiac: RRR; no murmurs, rubs, or gallops,no edema  Respiratory:  clear to auscultation bilaterally, normal work of breathing GI: soft, nontender, nondistended, + BS MS: no deformity or atrophy Skin: warm and dry, no rash Neuro:  Strength and sensation are intact Psych: euthymic mood, full affect   EKG:  EKG is ordered today. The ekg ordered today demonstrates sinus bradycardia with left axis deviation and with T-wave abnormalities improved since 02/12/14   Recent Labs: 02/12/2014: ALT 10; BUN 45*; Creatinine 2.1*; Hemoglobin 10.6*; Platelets 58.0*; Potassium 3.6; Sodium 139    Lipid Panel    Component Value Date/Time   CHOL 186 02/12/2014 0917   TRIG 106.0 02/12/2014 0917   HDL 48.80 02/12/2014 0917   CHOLHDL 4 02/12/2014 0917   VLDL 21.2 02/12/2014 0917   LDLCALC 116* 02/12/2014 0917      Wt Readings from Last 3 Encounters:  01/08/15 141 lb (63.957 kg)  07/24/14 138 lb (62.596 kg)  02/12/14 145 lb (65.772 kg)        ASSESSMENT AND PLAN:  1. Essential hypertension without heart failure 2. Hypercholesterolemia 3. history of pancytopenia 4. osteoarthritis of knees  Plan: We're checking a CBC and a basal metabolic panel today. She will return in 6 months for office visit and CBC and lipid panel hepatic function panel and basal metabolic panel. Continue same medication.   Current medicines are reviewed at length with the patient today.  The patient does not have concerns regarding medicines.  The following changes have been made:  no change   Orders Placed This Encounter  Procedures  . Lipid panel  . Hepatic function panel  . Basic metabolic panel  . CBC with Differential/Platelet  . EKG 12-Lead     Signed, Cassell Clement, MD  01/08/2015 5:57 PM    [facility] [address] [place], [place], [place]  [address] Phone: [phone]; Fax: [phone]

## 2015-01-24 ENCOUNTER — Other Ambulatory Visit: Payer: Self-pay | Admitting: Cardiology

## 2015-02-04 ENCOUNTER — Telehealth: Payer: Self-pay | Admitting: Cardiology

## 2015-02-04 ENCOUNTER — Other Ambulatory Visit: Payer: Self-pay | Admitting: *Deleted

## 2015-02-04 DIAGNOSIS — F419 Anxiety disorder, unspecified: Secondary | ICD-10-CM

## 2015-02-04 MED ORDER — DIAZEPAM 5 MG PO TABS: 5.0000 mg | ORAL_TABLET | Freq: Every evening | ORAL | Status: DC | PRN

## 2015-02-04 NOTE — Telephone Encounter (Signed)
Called Rx to pharmacy

## 2015-02-04 NOTE — Telephone Encounter (Signed)
New message      Refill generic valium at [facility]

## 2015-02-12 ENCOUNTER — Telehealth: Payer: Self-pay | Admitting: *Deleted

## 2015-02-12 ENCOUNTER — Other Ambulatory Visit (INDEPENDENT_AMBULATORY_CARE_PROVIDER_SITE_OTHER): Payer: Medicare Other

## 2015-02-12 DIAGNOSIS — E7800 Pure hypercholesterolemia, unspecified: Secondary | ICD-10-CM

## 2015-02-12 DIAGNOSIS — I119 Hypertensive heart disease without heart failure: Secondary | ICD-10-CM | POA: Diagnosis not present

## 2015-02-12 LAB — CBC WITH DIFFERENTIAL/PLATELET
BASOS ABS: 0 10*3/uL (ref 0.0–0.1)
Basophils Relative: 0.5 % (ref 0.0–3.0)
Eosinophils Absolute: 0.1 10*3/uL (ref 0.0–0.7)
Eosinophils Relative: 3 % (ref 0.0–5.0)
HCT: 33.9 % — ABNORMAL LOW (ref 36.0–46.0)
Hemoglobin: 10.8 g/dL — ABNORMAL LOW (ref 12.0–15.0)
Lymphocytes Relative: 15.5 % (ref 12.0–46.0)
Lymphs Abs: 0.6 10*3/uL — ABNORMAL LOW (ref 0.7–4.0)
MCHC: 31.8 g/dL (ref 30.0–36.0)
MCV: 74.4 fl — AB (ref 78.0–100.0)
Monocytes Absolute: 1.1 10*3/uL — ABNORMAL HIGH (ref 0.1–1.0)
Monocytes Relative: 26.7 % — ABNORMAL HIGH (ref 3.0–12.0)
Neutro Abs: 2.2 10*3/uL (ref 1.4–7.7)
Neutrophils Relative %: 54.3 % (ref 43.0–77.0)
Platelets: 30 10*3/uL — CL (ref 150.0–400.0)
RBC: 4.56 Mil/uL (ref 3.87–5.11)
RDW: 16.4 % — ABNORMAL HIGH (ref 11.5–15.5)
WBC: 4.1 10*3/uL (ref 4.0–10.5)

## 2015-02-12 LAB — HEPATIC FUNCTION PANEL
ALK PHOS: 46 U/L (ref 39–117)
ALT: 6 U/L (ref 0–35)
AST: 18 U/L (ref 0–37)
Albumin: 3.8 g/dL (ref 3.5–5.2)
Bilirubin, Direct: 0.1 mg/dL (ref 0.0–0.3)
TOTAL PROTEIN: 6.5 g/dL (ref 6.0–8.3)
Total Bilirubin: 0.6 mg/dL (ref 0.2–1.2)

## 2015-02-12 LAB — BASIC METABOLIC PANEL
BUN: 29 mg/dL — ABNORMAL HIGH (ref 6–23)
CHLORIDE: 101 meq/L (ref 96–112)
CO2: 35 meq/L — AB (ref 19–32)
Calcium: 9.6 mg/dL (ref 8.4–10.5)
Creatinine, Ser: 2 mg/dL — ABNORMAL HIGH (ref 0.40–1.20)
GFR: 24.59 mL/min — ABNORMAL LOW (ref >60.00)
Glucose, Bld: 89 mg/dL (ref 70–99)
Potassium: 4.2 mEq/L (ref 3.5–5.1)
SODIUM: 140 meq/L (ref 135–145)

## 2015-02-12 LAB — LIPID PANEL
CHOLESTEROL: 145 mg/dL (ref 0–200)
HDL: 47.6 mg/dL (ref >39.00)
LDL Cholesterol: 78 mg/dL (ref 0–99)
NONHDL: 97.4
Total CHOL/HDL Ratio: 3
Triglycerides: 97 mg/dL (ref 0.0–149.0)
VLDL: 19.4 mg/dL (ref 0.0–40.0)

## 2015-02-12 NOTE — Telephone Encounter (Signed)
I don't see any record of me seeing her in EPIC, must have been in remote past Needs to be treated as new patient, take to on call MD or any MD with new patient opening

## 2015-02-12 NOTE — Telephone Encounter (Signed)
Received a call from [facility] with Critical Lab value on platelets (30K). Patient has pancytopenia. Notes reflect this is an ongoing issue and patient has chosen to forego treatment at this time. Reviewed by Dr. Anne FuSkains. No orders obtained. Information will be routed to patient's other care providers at this time - Dr. Patty SermonsBrackbill and Dr. Truett PernaSherrill.

## 2015-02-12 NOTE — Telephone Encounter (Signed)
Looks like this is chronic, does primary MD want her seen here? I can see her next 1 month or any MD  Platelets in 30s for past several years

## 2015-02-12 NOTE — Telephone Encounter (Signed)
Kristina Shepherd  You had seen her many years ago. I believe she had some type of myelofibrosis with pancytopenia. She had declined any sort of treatment for her hematologic problem at that time.  Normally her platelet count does not run this low.  She is not on any antiplatelet agents such as aspirin or Plavix. Elijah Birkom

## 2015-02-13 NOTE — Telephone Encounter (Signed)
Spoke to daughter of patient to inform them of lab results and Dr. Yevonne PaxBrackbill's notations. No questions or concerns voiced by patient's daughter

## 2015-02-14 ENCOUNTER — Telehealth: Payer: Self-pay | Admitting: Cardiology

## 2015-02-14 ENCOUNTER — Telehealth: Payer: Self-pay | Admitting: *Deleted

## 2015-02-14 NOTE — Telephone Encounter (Signed)
Chart reviewed, notes from 02/12/15 reviewed.  I called and left a message with pt's daughter to call back with an answer for need to make a further appointment for her mother.  Dr Kalman DrapeSherrill's office was not called back yet.

## 2015-02-14 NOTE — Telephone Encounter (Signed)
Per Dr. Truett PernaSherrill; contacted Dr. Yevonne PaxBrackbill's office confirming whether [facility] needs to be [facility] per Dr. Patty SermonsBrackbill.  Received message back from their office from RN stating "pt declined visit with Dr. Truett PernaSherrill for low platelets at this time"  Dr. Truett PernaSherrill made aware.

## 2015-02-14 NOTE — Telephone Encounter (Signed)
I agree --no oncology consult needed. Patient has consistently declined workup for her pancytopenia.

## 2015-02-14 NOTE — Telephone Encounter (Signed)
New message      There seems to be some confusion regarding if Dr Patty SermonsBrackbill wanted Dr Truett PernaSherrill to see this pt for her low platelets.  Please call and let them know

## 2015-02-14 NOTE — Telephone Encounter (Signed)
Follow up      Daughter said her mother do not want to see Dr Truett PernaSherrill.  If she has cancer, she is not going to do anything because of her age

## 2015-02-14 NOTE — Telephone Encounter (Signed)
I will forward to Dr Patty SermonsBrackbill and inform Dr Kalman DrapeSherrill's office.

## 2015-03-11 ENCOUNTER — Telehealth: Payer: Self-pay | Admitting: Cardiology

## 2015-03-11 NOTE — Telephone Encounter (Signed)
Spoke with patient's daughter, Steward DroneBrenda, who states patient is declining in health and the family needs help taking care of her.  Patient currently has one son living with her and daughter lives next door.  Daughter states the family would like to have hospice evaluate the patient for possible referral; states patient is aware that help is needed.  I advised Steward DroneBrenda that Dr. Patty SermonsBrackbill and Juliette AlcideMelinda will not be in the office today and that I will forward message for their review tomorrow.  Steward DroneBrenda verbalized understanding and agreement and thanked me for the call.

## 2015-03-11 NOTE — Telephone Encounter (Signed)
New message      Talk to Kristina AlcideMelinda about getting help for pt----either hospice or a nursing home.  Pt fell some time ago and now she cannot walk at all.

## 2015-03-11 NOTE — Telephone Encounter (Signed)
Please arrange for hospice to evaluate her.

## 2015-03-12 NOTE — Telephone Encounter (Signed)
Have tried to call daughter twice and no answer or voicemail, will try again later

## 2015-03-12 NOTE — Telephone Encounter (Signed)
Spoke with daughter and patient had recent fall and now has a lot of hip pain Patient did fall on that hip per daughter Daughter unable to get patient down the steps to an appointment  Discussed with  Dr. Patty SermonsBrackbill and patient should go to ED for evaluation  Advised daughter Daughter will call back in a few days with update

## 2015-03-12 NOTE — Telephone Encounter (Signed)
Pt's dtr called back and has been made aware of Brackbill's decision for Hospice--dtr requesting call back asap--pt not doing well

## 2015-03-13 ENCOUNTER — Telehealth: Payer: Self-pay | Admitting: Cardiology

## 2015-03-13 NOTE — Telephone Encounter (Signed)
New message      Pt refuse to go to hosp for xrays because she fell.  Pt said she is better and is moving better this am.  Daughter said it will be ok if you want to send hospice nurse out but pt is not going to hosp.

## 2015-03-13 NOTE — Telephone Encounter (Signed)
[facility].  Okay to stop ferrous sulfate

## 2015-03-13 NOTE — Telephone Encounter (Signed)
Called [name] B LPN with Hospice for consult  Daughter also wanted to let  Dr. Patty SermonsBrackbill know that FeSO4 makes her nauseated and she is going to quit taking that  Will forward to  Dr. Patty SermonsBrackbill for review

## 2015-03-15 ENCOUNTER — Telehealth: Payer: Self-pay | Admitting: Cardiology

## 2015-03-15 NOTE — Telephone Encounter (Signed)
Left message to call back  

## 2015-03-15 NOTE — Telephone Encounter (Signed)
OR [phone], CHECKING ON HOSPICE VISITS FROM DISCUSSION ON Tuesday--KNOWS CALL WILL BE Monday

## 2015-03-18 NOTE — Telephone Encounter (Signed)
[facility]

## 2015-03-18 NOTE — Telephone Encounter (Signed)
Spoke with daughter and she has spoken with Kandee Keenory regarding patient

## 2015-03-25 ENCOUNTER — Telehealth: Payer: Self-pay | Admitting: Cardiology

## 2015-03-25 NOTE — Telephone Encounter (Signed)
New message       Calling to let the doctor know pt was admitted to hospice today

## 2015-03-26 NOTE — Telephone Encounter (Signed)
Dr. Brackbill aware 

## 2015-03-29 ENCOUNTER — Telehealth: Payer: Self-pay | Admitting: Cardiology

## 2015-03-29 NOTE — Telephone Encounter (Signed)
New  Problem    Kristina Shepherd need an order for mobile xray of left hip due to a recent fall due to pain at movement. Need order for mobile doppler for rt lower leg has concussion measuring 10 cm by 5cm it is raised and hot to touch. Please call and advise as soon as possible.

## 2015-03-29 NOTE — Telephone Encounter (Signed)
Verbal ok given, ok per  Dr. Patty SermonsBrackbill  Advised ok to use Tylenol 2 every 4 hours as needed for pain as requested by nurse

## 2015-04-08 ENCOUNTER — Telehealth: Payer: Self-pay | Admitting: Cardiology

## 2015-04-08 NOTE — Telephone Encounter (Signed)
New Prob   Pts care will be transferred from home to skilled facility. Documentation will be faxed over which needs to be completed and signed. Requesting any additional diagnosis or medications.

## 2015-04-10 NOTE — Telephone Encounter (Signed)
Spoke with Eunice BlaseDebbie and advised no form received

## 2015-04-10 NOTE — Telephone Encounter (Signed)
Follow up       Calling to see if we received a FL2 form

## 2015-04-11 NOTE — Telephone Encounter (Signed)
Form received, signed by  Dr. Patty Sermons, and given to medical records to be faxed back

## 2015-04-15 ENCOUNTER — Telehealth: Payer: Self-pay | Admitting: Cardiology

## 2015-04-15 NOTE — Telephone Encounter (Signed)
New Prob    Pt has recently fell in her home. Family is wanting her placed in a skilled facility ASAP. Requesting information regarding moving forward with this along with FL-2 form that was faxed over last week. Please call.

## 2015-04-15 NOTE — Telephone Encounter (Signed)
Fax has not been received. Fax number [phone] for recheck. Resent fax to [facility]. Fax received. Will call tomorrow to see where to send original.

## 2015-04-16 ENCOUNTER — Telehealth: Payer: Self-pay | Admitting: Cardiology

## 2015-04-16 NOTE — Telephone Encounter (Signed)
New message      FL2 form can be mailed to the daughter.  She spoke with you yesterday

## 2015-04-16 NOTE — Telephone Encounter (Signed)
Mailed FL2 form to daughter and faxed was received.

## 2015-04-23 ENCOUNTER — Encounter: Payer: Self-pay | Admitting: Cardiology

## 2015-04-23 ENCOUNTER — Telehealth: Payer: Self-pay | Admitting: Cardiology

## 2015-04-23 NOTE — Telephone Encounter (Signed)
Spoke with daughter, will call hospice tomorrow

## 2015-04-23 NOTE — Telephone Encounter (Signed)
New message      Pt is at [facility].  Daughter want to take pt home under hospice.  Will Dr Patty Sermons agree to be the attending doctor and if so will you want hospice doctors to do symptom management?

## 2015-04-23 NOTE — Telephone Encounter (Signed)
Left message for Brenda to call back 

## 2015-04-24 NOTE — Telephone Encounter (Signed)
Follow Up     Hospice is calling in following up on correspondence below regarding Dr. Patty Sermons agreeing to be attending provider. Please call.

## 2015-04-24 NOTE — Telephone Encounter (Signed)
Spoke with patients daughter and she will go back with [facility] as she was using prior to [employer] with Kandee Keen B LPN and he will contact daughter

## 2015-05-07 ENCOUNTER — Other Ambulatory Visit: Payer: Self-pay | Admitting: Cardiology

## 2015-05-07 DIAGNOSIS — F419 Anxiety disorder, unspecified: Secondary | ICD-10-CM

## 2015-05-07 NOTE — Telephone Encounter (Signed)
We could try xanax 0.25 mg BID for daytime anxiety.

## 2015-05-07 NOTE — Telephone Encounter (Signed)
Advised patients daughter [facility]

## 2015-05-07 NOTE — Telephone Encounter (Signed)
NewMessage  [facility] from [facility] calling to speak w/ RN about possible med change. Per RN: Pt has diazapem- 5mg  @ night, is taking butnot holding throughout the day. RN calling about something for the daytime. Please call back and discuss.

## 2015-05-07 NOTE — Telephone Encounter (Signed)
Per daughter patient is having anxiety attacks during the day and gets real shaky Patient is getting Diazepam 5 mg at bedtime Will forward to  Dr. Patty SermonsBrackbill for review

## 2015-05-08 MED ORDER — ALPRAZOLAM 0.25 MG PO TABS: 0.2500 mg | ORAL_TABLET | Freq: Two times a day (BID) | ORAL | Status: AC | PRN

## 2015-05-09 ENCOUNTER — Telehealth: Payer: Self-pay | Admitting: Cardiology

## 2015-05-09 NOTE — Telephone Encounter (Signed)
I spoke with Judy & she states pt reports "Kristina Hongeverything burns when she drinks". Kristina HongJudy states pt has taken tums in the past & it has helped.  Asked that the pt try tums or Gaviscon as this also helps with the burning. She will contact the family  Will forward this to Dr. Patty SermonsBrackbill for recommendations Mylo Redebbie Caitlynn Ju RN

## 2015-05-09 NOTE — Telephone Encounter (Signed)
New Message  [facility]y [facility] went to see the pt. Reports the pt is having acid refluc. What would we recommend and can they call in a script. Please call back to discuss.

## 2015-05-10 NOTE — Telephone Encounter (Signed)
Okay to take TUMs or gaviscon prn

## 2015-05-16 ENCOUNTER — Telehealth: Payer: Self-pay | Admitting: Cardiology

## 2015-05-16 NOTE — Telephone Encounter (Signed)
New message      Hospice is missing a plan of care order and a terminal illness order.  Please call and let her know if we received it

## 2015-05-17 NOTE — Telephone Encounter (Signed)
Spoke with Juliette AlcideMelinda and advised signed and will fax back

## 2015-06-03 ENCOUNTER — Other Ambulatory Visit: Payer: Self-pay

## 2015-06-03 DIAGNOSIS — F419 Anxiety disorder, unspecified: Secondary | ICD-10-CM

## 2015-06-12 MED ORDER — DIAZEPAM 5 MG PO TABS
5.0000 mg | ORAL_TABLET | Freq: Every evening | ORAL | Status: DC | PRN
Start: 2015-06-12 — End: 2015-12-28

## 2015-06-19 ENCOUNTER — Telehealth: Payer: Self-pay | Admitting: Cardiology

## 2015-06-19 NOTE — Telephone Encounter (Signed)
Will forward to Melinda. 

## 2015-06-19 NOTE — Telephone Encounter (Signed)
New message      For [facility] health care do not have a doctor to come out and check pts ears, however, they have other recommendations as to what the family can do.  Ok to wait until Kristina Shepherd returns to call

## 2015-06-20 NOTE — Telephone Encounter (Signed)
Spoke with Kristina Shepherd and she stated that family said someone from office recommended asking Hosp[facility] to have physician come out to evaluate ears.  Kristina Shepherd did explain to family that Hospice did not offer that service but name of physician that did come to home given or they could help with arranging transportation, both would be out of pocket expenses for family.  Explained to Kristina Shepherd I did not see any record of that but if she needed a referral to ENT for her ears we could try to schedule that for her.  Kristina Shepherd will be following up with patient tomorrow with home visit.  She will call back if needs anything from office

## 2015-08-26 ENCOUNTER — Telehealth: Payer: Self-pay | Admitting: Cardiology

## 2015-08-26 NOTE — Telephone Encounter (Signed)
Left message to call back  

## 2015-08-26 NOTE — Telephone Encounter (Signed)
Per [name] there have been several instances that they were concerned patient being left alone  Patient is completely nonambulatory and hears very little Eunice BlaseDebbie has spoken with daughter Steward DroneBrenda regarding this and even had her sign an agreement that she would not be left alone and if so will be reported.  Patients son is supposed to be sitting with her when Steward DroneBrenda or paid caregiver leaves. [name] realizes Steward DroneBrenda is trying and can only go on what her brother is telling her he will do Just wanted for  Dr. Patty SermonsBrackbill to be aware

## 2015-08-26 NOTE — Telephone Encounter (Signed)
Thanks for the update

## 2015-08-26 NOTE — Telephone Encounter (Signed)
New message   Nurse calling   Have some concern that patient is left alone in the pm .  A home visit will be made today - If patient is left alone a call will  Be made to adult protective services.

## 2015-08-27 ENCOUNTER — Telehealth: Payer: Self-pay | Admitting: Cardiology

## 2015-08-27 NOTE — Telephone Encounter (Signed)
New message      Talk to the nurse to follow up on conversation from yesterday

## 2015-08-27 NOTE — Telephone Encounter (Signed)
Thanks for update

## 2015-08-27 NOTE — Telephone Encounter (Signed)
FYI Spoke with [name], she did call adult protective service and they do not feel at this time any intervention is needed at this time

## 2015-10-24 ENCOUNTER — Telehealth: Payer: Self-pay | Admitting: Cardiology

## 2015-10-24 NOTE — Telephone Encounter (Signed)
New Message  [name], Child psychotherapistsocial worker from [facility], calling to speak w/ Rn- to update info. Please call back and discuss.

## 2015-10-24 NOTE — Telephone Encounter (Signed)
Left message to call back  

## 2015-10-24 NOTE — Telephone Encounter (Signed)
Thanks for update

## 2015-10-24 NOTE — Telephone Encounter (Signed)
Follow up ° ° ° ° ° °Returning a call to the nurse °

## 2015-10-24 NOTE — Telephone Encounter (Signed)
Social worker Kristina ShelterDebbie Shepherd is calling to let Kristina Shepherd know that the patient has been left alone more often and Adult Protective Services has been called once again. Her message was similar to previous message that was left in October as shown below. Will send to Kristina Shepherd so he is aware.  Kristina BlanksMelinda B Shepherd at 08/26/2015 5:07 PM     Status: Signed       Expand All Collapse All   Per Kristina Blaseebbie there have been several instances that they were concerned patient being left alone  Patient is completely nonambulatory and hears very little Kristina BlaseDebbie has spoken with daughter Kristina Shepherd regarding this and even had her sign an agreement that she would not be left alone and if so will be reported.  Patients son is supposed to be sitting with her when Kristina Shepherd or paid caregiver leaves. Kristina realizes Kristina Shepherd is trying and can only go on what her brother is telling her he will do Just wanted for Kristina Shepherd to be aware

## 2015-12-28 ENCOUNTER — Other Ambulatory Visit: Payer: Self-pay | Admitting: Cardiology

## 2015-12-28 DIAGNOSIS — G4700 Insomnia, unspecified: Secondary | ICD-10-CM

## 2016-01-02 ENCOUNTER — Telehealth: Payer: Self-pay | Admitting: Cardiology

## 2016-01-02 NOTE — Telephone Encounter (Signed)
He wants to know the strength of her Diazepam that was called in this morning.

## 2016-01-02 NOTE — Telephone Encounter (Signed)
Spoke with pharmacy and gave Valium dose of 5 mg

## 2016-04-02 DEATH — deceased
